# Patient Record
Sex: Female | Born: 1995 | Hispanic: No | Marital: Single | Smoking: Never smoker
Health system: Southern US, Community
[De-identification: ages and names within clinical notes are randomized; demographics above are authoritative.]

## PROBLEM LIST (undated history)

## (undated) DIAGNOSIS — F32A Depression, unspecified: Secondary | ICD-10-CM

## (undated) DIAGNOSIS — F329 Major depressive disorder, single episode, unspecified: Secondary | ICD-10-CM

---
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
## 2016-03-10 ENCOUNTER — Ambulatory Visit (HOSPITAL_COMMUNITY)
Admission: EM | Admit: 2016-03-10 | Discharge: 2016-03-10 | Disposition: A | Discharge status: Home / Self Care | Payer: No Typology Code available for payment source | Source: Ambulatory Visit | Attending: Emergency Medicine | Admitting: Emergency Medicine

## 2016-03-10 ENCOUNTER — Encounter (HOSPITAL_COMMUNITY): Payer: Self-pay | Admitting: Emergency Medicine

## 2016-03-10 ENCOUNTER — Emergency Department (HOSPITAL_COMMUNITY)
Admission: EM | Admit: 2016-03-10 | Discharge: 2016-03-10 | Disposition: A | Discharge status: Home / Self Care | Payer: Commercial Managed Care - PPO | Attending: Emergency Medicine | Admitting: Emergency Medicine

## 2016-03-10 DIAGNOSIS — T7421XA Adult sexual abuse, confirmed, initial encounter: Secondary | ICD-10-CM | POA: Diagnosis not present

## 2016-03-10 DIAGNOSIS — R102 Pelvic and perineal pain: Secondary | ICD-10-CM | POA: Diagnosis not present

## 2016-03-10 DIAGNOSIS — Y9389 Activity, other specified: Secondary | ICD-10-CM | POA: Insufficient documentation

## 2016-03-10 DIAGNOSIS — Z0441 Encounter for examination and observation following alleged adult rape: Secondary | ICD-10-CM | POA: Diagnosis present

## 2016-03-10 DIAGNOSIS — J029 Acute pharyngitis, unspecified: Secondary | ICD-10-CM | POA: Diagnosis not present

## 2016-03-10 DIAGNOSIS — Y9289 Other specified places as the place of occurrence of the external cause: Secondary | ICD-10-CM | POA: Insufficient documentation

## 2016-03-10 DIAGNOSIS — R070 Pain in throat: Secondary | ICD-10-CM | POA: Diagnosis not present

## 2016-03-10 DIAGNOSIS — Y998 Other external cause status: Secondary | ICD-10-CM | POA: Diagnosis not present

## 2016-03-10 DIAGNOSIS — Z8659 Personal history of other mental and behavioral disorders: Secondary | ICD-10-CM | POA: Insufficient documentation

## 2016-03-10 HISTORY — DX: Depression, unspecified: F32.A

## 2016-03-10 HISTORY — DX: Major depressive disorder, single episode, unspecified: F32.9
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
## 2016-03-10 NOTE — ED Notes (Signed)
Bed: WA13 Expected date:  Expected time:  Means of arrival:  Comments: [employer] 
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
## 2016-04-16 NOTE — Discharge Instructions (Signed)
Sexual Assault or Rape °Sexual assault is any sexual activity that a person is forced, threatened, or coerced into participating in. It may or may not involve physical contact. You are being sexually abused if you are forced to have sexual contact of any kind. Sexual assault is called rape if penetration has occurred (vaginal, oral, or anal). Many times, sexual assaults are committed by a friend, relative, or associate. Sexual assault and rape are never the victim's fault.  °Sexual assault can result in various health problems for the person who was assaulted. Some of these problems include: °· Physical injuries in the genital area or other areas of the body. °· Risk of unwanted pregnancy. °· Risk of sexually transmitted infections (STIs). °· Psychological problems such as anxiety, depression, or posttraumatic stress disorder. °WHAT STEPS SHOULD BE TAKEN AFTER A SEXUAL ASSAULT? °If you have been sexually assaulted, you should take the following steps as soon as possible: °· Go to a safe area as quickly as possible and call your local emergency services (911 in [place] Get away from the area where you have been attacked.   °· Do not wash, shower, comb your hair, or clean any part of your body.   °· Do not change your clothes.   °· Do not remove or touch anything in the area where you were assaulted.   °· Go to an emergency room for a complete physical exam. Get the necessary tests to protect yourself from STIs or pregnancy. You may be treated for an STI even if no signs of one are present. Emergency contraceptive medicines are also available to help prevent pregnancy, if this is desired. You may need to be examined by a specially trained health care provider. °· Have the health care provider collect evidence during the exam, even if you are not sure if you will file a report with the police. °· Find out how to file the correct papers with the authorities. This is important for all assaults, even if they were committed  by a family member or friend. °· Find out where you can get additional help and support, such as a local rape crisis center. °· Follow up with your health care provider as directed.   °HOW CAN YOU REDUCE THE CHANCES OF SEXUAL ASSAULT? °Take the following steps to help reduce your chances of being sexually assaulted: °· Consider carrying mace or pepper spray for protection against an attacker.   °· Consider taking a self-defense course. °· Do not try to fight off an attacker if he or she has a gun or knife.   °· Be aware of your surroundings, what is happening around you, and who might be there.   °· Be assertive, trust your instincts, and walk with confidence and direction. °· Be careful not to drink too much alcohol or use other intoxicants. These can reduce your ability to fight off an assault. °· Always lock your doors and windows. Be sure to have high-quality locks for your home.   °· Do not let people enter your house if you do not know them.   °· Get a home security system that has a siren if you are able.   °· Protect the keys to your house and car. Do not lend them out. Do not put your name and address on them. If you lose them, get your locks changed.   °· Always lock your car and have your key ready to open the door before approaching the car.   °· Park in a well-lit and busy area. °· Plan your driving routes   so that you travel on well-lit and frequently used streets.  °· Keep your car serviced. Always have at least half a tank of gas in it.   °· Do not go into isolated areas alone. This includes open garages, empty buildings or offices, or public laundry rooms.   °· Do not walk or jog alone, especially when it is dark.   °· Never hitchhike.   °· If your car breaks down, call the police for help on your cell phone and stay inside the car with your doors locked and windows up.   °· If you are being followed, go to a busy area and call for help.   °· If you are stopped by a police officer, especially one in  an unmarked police car, keep your door locked. Do not put your window down all the way. Ask the officer to show you identification first.   °· Be aware of "date rape drugs" that can be placed in a drink when you are not looking. These drugs can make you unable to fight off an assault. °FOR MORE INFORMATION °· Office on Women's Health, [place] Department of Health and Human Services: www.womenshealth.gov/violence-against-women/types-of-violence/sexual-assault-and-abuse.html °· National Sexual Assault Hotline: 1-800-656-HOPE (4673) °· National Domestic Violence Hotline: 1-800-799-SAFE (7233) or www.thehotline.org °  °This information is not intended to replace advice given to you by your health care provider. Make sure you discuss any questions you have with your health care provider. °  °Document Released: 11/30/2000 Document Revised: 08/05/2013 Document Reviewed: 07/08/2015 °Elsevier Interactive Patient Education ©2016 Elsevier Inc. ° °

## 2016-06-22 ENCOUNTER — Emergency Department (HOSPITAL_COMMUNITY)
Admission: EM | Admit: 2016-06-22 | Discharge: 2016-06-22 | Disposition: A | Discharge status: Home / Self Care | Payer: Commercial Managed Care - PPO | Attending: Emergency Medicine | Admitting: Emergency Medicine

## 2016-06-22 ENCOUNTER — Encounter (HOSPITAL_COMMUNITY): Payer: Self-pay | Admitting: Emergency Medicine

## 2016-06-22 DIAGNOSIS — F329 Major depressive disorder, single episode, unspecified: Secondary | ICD-10-CM | POA: Diagnosis not present

## 2016-06-22 DIAGNOSIS — R21 Rash and other nonspecific skin eruption: Secondary | ICD-10-CM | POA: Insufficient documentation

## 2016-06-22 NOTE — Discharge Instructions (Signed)

## 2016-06-22 NOTE — ED Notes (Signed)
Pt states that she has had a rash on her R hip/leg area x 3 days. Sometimes itches. Alert and oriented

## 2016-06-22 NOTE — ED Provider Notes (Signed)
CSN: [id number]     Arrival date & time 06/22/16  2055 History   First MD Initiated Contact with Patient 06/22/16 2249     Chief Complaint  Patient presents with  . Rash     (Consider location/radiation/quality/duration/timing/severity/associated sxs/prior Treatment) HPI Comments: Patient here complaining of diffuse rash is nonpruritic and started after she had an implantable birth control device placed. Was initially on her chest and that went away. Is now spread to her thighs. Denies any fever. No treatment use prior to arrival nothing makes it better or worse. No prior history of same  Patient is a 20 y.o. female presenting with rash. The history is provided by the patient and a parent.  Rash   Past Medical History  Diagnosis Date  . Depression    History reviewed. No pertinent past surgical history. History reviewed. No pertinent family history. Social History  Substance Use Topics  . Smoking status: Never Smoker   . Smokeless tobacco: None  . Alcohol Use: No   OB History    No data available     Review of Systems  Skin: Positive for rash.  All other systems reviewed and are negative.     Allergies  Review of patient's allergies indicates no known allergies.  Home Medications   Prior to Admission medications   Not on File   BP 109/83 mmHg  Pulse 85  Temp(Src) 98.6 F (37 C) (Oral)  Resp 18  SpO2 100%  LMP 06/16/2016 (Exact Date) Physical Exam  Constitutional: She is oriented to person, place, and time. She appears well-developed and well-nourished.  Non-toxic appearance.  HENT:  Head: Normocephalic and atraumatic.  Eyes: Conjunctivae are normal. Pupils are equal, round, and reactive to light.  Neck: Normal range of motion.  Cardiovascular: Normal rate.   Pulmonary/Chest: Effort normal.  Neurological: She is alert and oriented to person, place, and time.  Skin: Skin is warm and dry. Rash noted. Rash is papular.  Psychiatric: She has a normal mood and  affect.  Nursing note and vitals reviewed.   ED Course  Procedures (including critical care time) Labs Review Labs Reviewed - No data to display  Imaging Review No results found. I have personally reviewed and evaluated these images and lab results as part of my medical decision-making.   EKG Interpretation None      MDM   Final diagnoses:  None    Patient instructed to follow-up with her dermatologist and return precautions given    Lorre NickAnthony Malone Admire, MD 06/22/16 2303

## 2018-11-09 ENCOUNTER — Encounter (HOSPITAL_COMMUNITY): Payer: Self-pay | Admitting: *Deleted

## 2018-11-09 ENCOUNTER — Other Ambulatory Visit: Payer: Self-pay

## 2018-11-09 ENCOUNTER — Emergency Department (HOSPITAL_COMMUNITY)
Admission: EM | Admit: 2018-11-09 | Discharge: 2018-11-09 | Disposition: A | Discharge status: Home / Self Care | Payer: 59 | Attending: Emergency Medicine | Admitting: Emergency Medicine

## 2018-11-09 ENCOUNTER — Emergency Department (HOSPITAL_COMMUNITY): Payer: 59

## 2018-11-09 DIAGNOSIS — N83201 Unspecified ovarian cyst, right side: Secondary | ICD-10-CM

## 2018-11-09 DIAGNOSIS — N83291 Other ovarian cyst, right side: Secondary | ICD-10-CM | POA: Diagnosis not present

## 2018-11-09 DIAGNOSIS — R1033 Periumbilical pain: Secondary | ICD-10-CM | POA: Diagnosis present

## 2018-11-09 LAB — COMPREHENSIVE METABOLIC PANEL
ALT: 22 U/L (ref 0–44)
AST: 19 U/L (ref 15–41)
Albumin: 4.3 g/dL (ref 3.5–5.0)
Alkaline Phosphatase: 51 U/L (ref 38–126)
Anion gap: 10 (ref 5–15)
BUN: 11 mg/dL (ref 6–20)
CO2: 20 mmol/L — ABNORMAL LOW (ref 22–32)
Calcium: 9.1 mg/dL (ref 8.9–10.3)
Chloride: 107 mmol/L (ref 98–111)
Creatinine, Ser: 0.74 mg/dL (ref 0.44–1.00)
GFR calc Af Amer: >60 mL/min (ref >60)
GFR calc non Af Amer: >60 mL/min (ref >60)
Glucose, Bld: 100 mg/dL — ABNORMAL HIGH (ref 70–99)
Potassium: 3.2 mmol/L — ABNORMAL LOW (ref 3.5–5.1)
Sodium: 137 mmol/L (ref 135–145)
Total Bilirubin: 0.7 mg/dL (ref 0.3–1.2)
Total Protein: 7.3 g/dL (ref 6.5–8.1)

## 2018-11-09 LAB — I-STAT BETA HCG BLOOD, ED (MC, WL, AP ONLY): I-stat hCG, quantitative: <5 m[IU]/mL (ref <5)

## 2018-11-09 LAB — CBC WITH DIFFERENTIAL/PLATELET
Abs Immature Granulocytes: 0.03 10*3/uL (ref 0.00–0.07)
Basophils Absolute: 0 10*3/uL (ref 0.0–0.1)
Basophils Relative: 0 %
Eosinophils Absolute: 0 10*3/uL (ref 0.0–0.5)
Eosinophils Relative: 0 %
HCT: 41.6 % (ref 36.0–46.0)
Hemoglobin: 14.1 g/dL (ref 12.0–15.0)
Immature Granulocytes: 0 %
Lymphocytes Relative: 17 %
Lymphs Abs: 2.3 10*3/uL (ref 0.7–4.0)
MCH: 30.9 pg (ref 26.0–34.0)
MCHC: 33.9 g/dL (ref 30.0–36.0)
MCV: 91.2 fL (ref 80.0–100.0)
Monocytes Absolute: 0.6 10*3/uL (ref 0.1–1.0)
Monocytes Relative: 4 %
Neutro Abs: 10.5 10*3/uL — ABNORMAL HIGH (ref 1.7–7.7)
Neutrophils Relative %: 79 %
Platelets: 272 10*3/uL (ref 150–400)
RBC: 4.56 MIL/uL (ref 3.87–5.11)
RDW: 11.6 % (ref 11.5–15.5)
WBC: 13.5 10*3/uL — ABNORMAL HIGH (ref 4.0–10.5)
nRBC: 0 % (ref 0.0–0.2)

## 2018-11-09 LAB — URINALYSIS, ROUTINE W REFLEX MICROSCOPIC
Bilirubin Urine: NEGATIVE
Glucose, UA: NEGATIVE mg/dL
Hgb urine dipstick: NEGATIVE
Ketones, ur: 80 mg/dL — AB
Leukocytes, UA: NEGATIVE
Nitrite: NEGATIVE
Protein, ur: NEGATIVE mg/dL
Specific Gravity, Urine: 1.02 (ref 1.005–1.030)
pH: 5 (ref 5.0–8.0)

## 2018-11-09 LAB — LIPASE, BLOOD: Lipase: 29 U/L (ref 11–51)

## 2018-11-09 MED ORDER — IOHEXOL 300 MG/ML  SOLN
100.0000 mL | Freq: Once | INTRAMUSCULAR | Status: AC | PRN
  Administered 2018-11-09: 100 mL via INTRAVENOUS

## 2018-11-09 NOTE — ED Notes (Signed)
Patient transported to CT 

## 2018-11-09 NOTE — ED Provider Notes (Signed)
[facility] Provider Note   CSN: [id number] Arrival date & time: 11/09/18  1615     History   Chief Complaint Chief Complaint  Patient presents with  . Abdominal Pain    HPI Crystal Mcdonald is a 22 y.o. female.  HPI   22 year old female with abdominal pain.  Began around 3:00 this morning while in bed.  Pain is periumbilical.  Is been persistent throughout the day.  Worse with certain movements.  Denies any associated symptoms such as nausea, vomiting or diarrhea.  No urinary complaints.  No unusual vaginal bleeding or discharge.  No fevers or chills.  No significant past medical history.  She has not tried taking anything for symptoms.  Past Medical History:  Diagnosis Date  . Depression     There are no active problems to display for this patient.   History reviewed. No pertinent surgical history.   OB History   None      Home Medications    Prior to Admission medications   Not on File    Family History History reviewed. No pertinent family history.  Social History Social History   Tobacco Use  . Smoking status: Never Smoker  Substance Use Topics  . Alcohol use: No  . Drug use: No     Allergies   Patient has no known allergies.   Review of Systems Review of Systems All systems reviewed and negative, other than as noted in HPI.   Physical Exam Updated Vital Signs BP 132/86 (BP Location: Right Arm)   Pulse (!) 112   Temp 98.2 F (36.8 C) (Oral)   Resp 18   SpO2 99%   Physical Exam  Constitutional: She appears well-developed and well-nourished. No distress.  HENT:  Head: Normocephalic and atraumatic.  Eyes: Conjunctivae are normal. Right eye exhibits no discharge. Left eye exhibits no discharge.  Neck: Neck supple.  Cardiovascular: Normal rate, regular rhythm and normal heart sounds. Exam reveals no gallop and no friction rub.  No murmur heard. Pulmonary/Chest: Effort normal and breath sounds  normal. No respiratory distress.  Abdominal: Soft. She exhibits no distension. There is tenderness.  Tenderness in the right lower quadrant without rebound or guarding.  Musculoskeletal: She exhibits no edema or tenderness.  Neurological: She is alert.  Skin: Skin is warm and dry.  Psychiatric: She has a normal mood and affect. Her behavior is normal. Thought content normal.  Nursing note and vitals reviewed.    ED Treatments / Results  Labs (all labs ordered are listed, but only abnormal results are displayed) Labs Reviewed  CBC WITH DIFFERENTIAL/PLATELET - Abnormal; Notable for the following components:      Result Value   WBC 13.5 (*)    Neutro Abs 10.5 (*)    All other components within normal limits  COMPREHENSIVE METABOLIC PANEL - Abnormal; Notable for the following components:   Potassium 3.2 (*)    CO2 20 (*)    Glucose, Bld 100 (*)    All other components within normal limits  URINALYSIS, ROUTINE W REFLEX MICROSCOPIC - Abnormal; Notable for the following components:   Ketones, ur 80 (*)    All other components within normal limits  LIPASE, BLOOD  I-STAT BETA HCG BLOOD, ED ([facility], [facility], [facility] ONLY)    EKG None  Radiology No results found.   Ct Abdomen Pelvis W Contrast  Result Date: 11/09/2018 CLINICAL DATA:  Acute onset paraumbilical pain this morning. Suspected appendicitis. EXAM: CT ABDOMEN AND  PELVIS WITH CONTRAST TECHNIQUE: Multidetector CT imaging of the abdomen and pelvis was performed using the standard protocol following bolus administration of intravenous contrast. CONTRAST:  OMNIPAQUE IOHEXOL 300 MG/ML  SOLN COMPARISON:  None. FINDINGS: Lower Chest: No acute findings. Hepatobiliary: No hepatic masses identified. Gallbladder is unremarkable. Pancreas:  No mass or inflammatory changes. Spleen: Within normal limits in size and appearance. Adrenals/Urinary Tract: No masses identified. No evidence of hydronephrosis. Stomach/Bowel: No evidence of obstruction,  inflammatory process or abnormal fluid collections. Normal appendix visualized. Vascular/Lymphatic: No pathologically enlarged lymph nodes. No abdominal aortic aneurysm. Reproductive: Normal appearance of uterus. A simple appearing right ovarian cyst is seen measuring 3.3 x 2.4 cm. No evidence of inflammatory process or abnormal fluid collections. Other:  None. Musculoskeletal: No suspicious bone lesions identified. 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 IMPRESSION: No evidence of appendicitis. 3.3 cm benign-appearing right ovarian cyst, most likely physiologic. No further imaging evaluation required. This recommendation follows ACR consensus guidelines: White Paper of the ACR Incidental Findings Committee II on Adnexal Findings. J Am Coll Radiol [id number]. Incidentally noted bilateral L5 pars defects. Electronically Signed   By: Myles Rosenthal M.D.   On: 11/09/2018 20:03    Procedures Procedures (including critical care time)  Medications Ordered in ED Medications  iohexol (OMNIPAQUE) 300 MG/ML solution 100 mL (100 mLs Intravenous Contrast Given 11/09/18 1913)     Initial Impression / Assessment and Plan / ED Course  I have reviewed the triage vital signs and the nursing notes.  Pertinent labs & imaging results that were available during my care of the patient were reviewed by me and considered in my medical decision making (see chart for details).     22 year old female with periumbilical abdominal pain.  She is actually tender in her right lower quadrant without peritoneal signs.  Will check basic labs, UA and CT the abdomen and pelvis.  Declining pain medicine currently.  CT with ovarian cyst. Likely explanation of her symptoms. Symptomatic tx. Return precautions discussed.   Final Clinical Impressions(s) / ED Diagnoses   Final diagnoses:  Cyst of right ovary    ED Discharge Orders    None       Raeford Razor, MD 11/20/18 1052

## 2018-11-09 NOTE — ED Triage Notes (Signed)
Pt reports mid abd pain around umbilicus, started around 0300. Pt denies urinary symptoms or n/v/d.

## 2019-08-21 IMAGING — CT CT ABD-PELV W/ CM
2 of 4 series · 16 of 46 positions shown, 18 images · IV contrast (APPLIED)
Comparison: None.

CLINICAL DATA: Acute onset paraumbilical pain this morning.
Suspected appendicitis.

EXAM:
CT ABDOMEN AND PELVIS WITH CONTRAST
TECHNIQUE: Multidetector CT imaging of the abdomen and pelvis was performed
using the standard protocol following bolus administration of
intravenous contrast.
CONTRAST:  100mL OMNIPAQUE IOHEXOL 300 MG/ML  SOLN

[Series 3: abdomen 5.0 · axial · 0.80mm/px · z∈[-484,-94]mm · 13 of 91 slices shown, 15 images]
[im 7/91  soft-tissue]
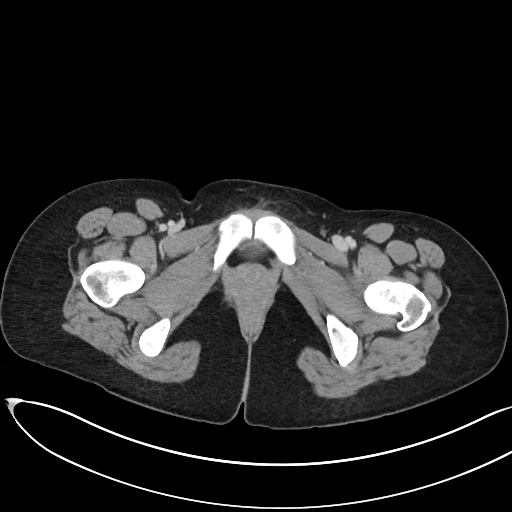
[im 7/91  bone]
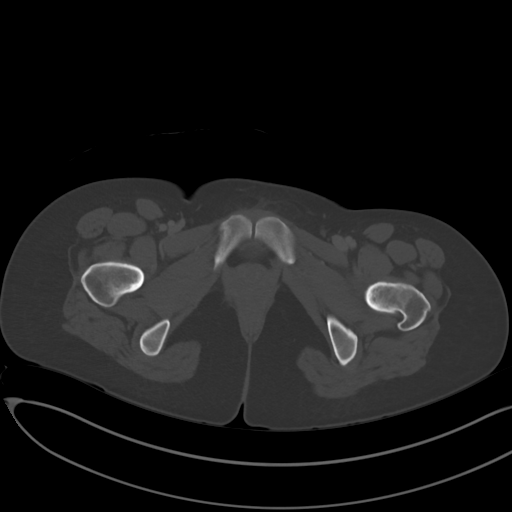
[im 13/91  soft-tissue]
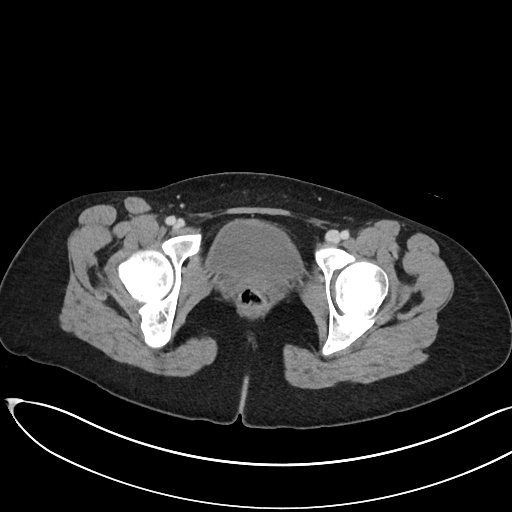
[im 19/91  soft-tissue]
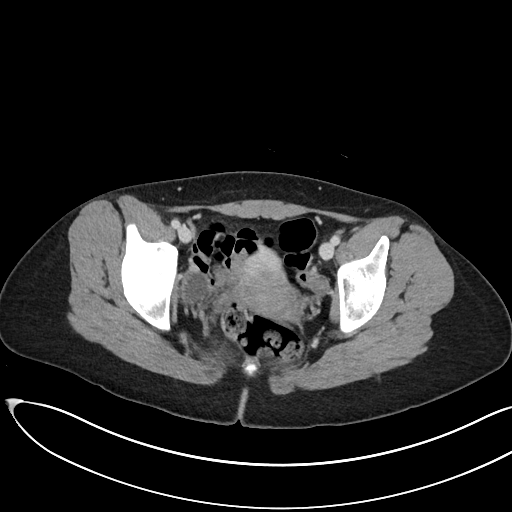
[im 25/91  soft-tissue]
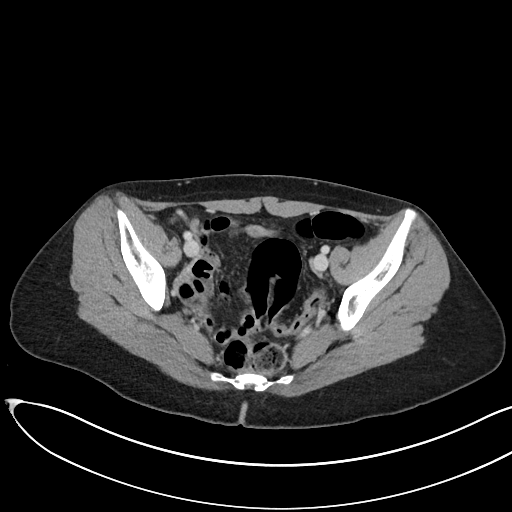
[im 31/91  soft-tissue]
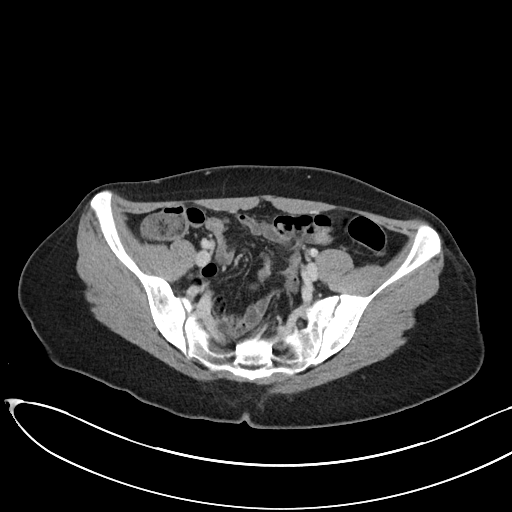
[im 37/91  soft-tissue]
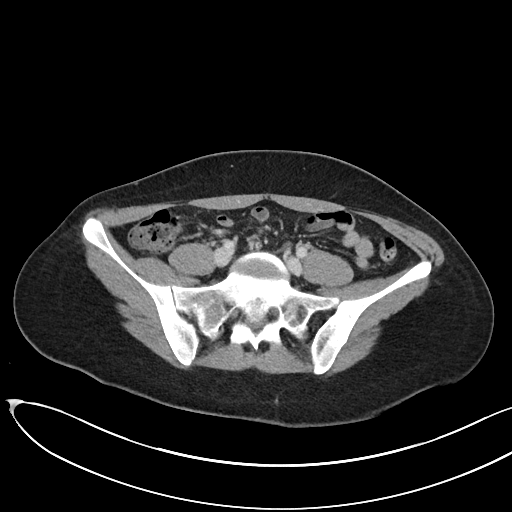
[im 49/91  soft-tissue]
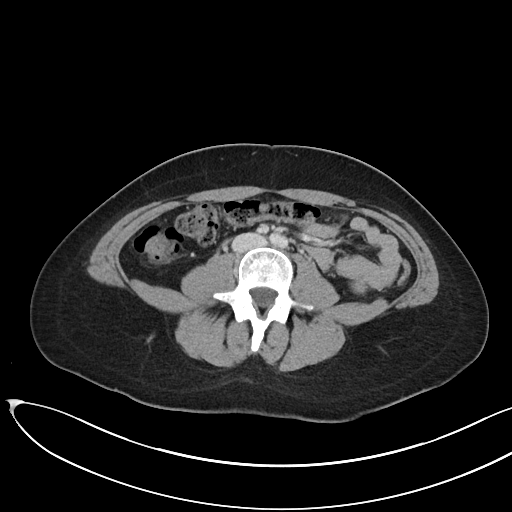
[im 55/91  soft-tissue]
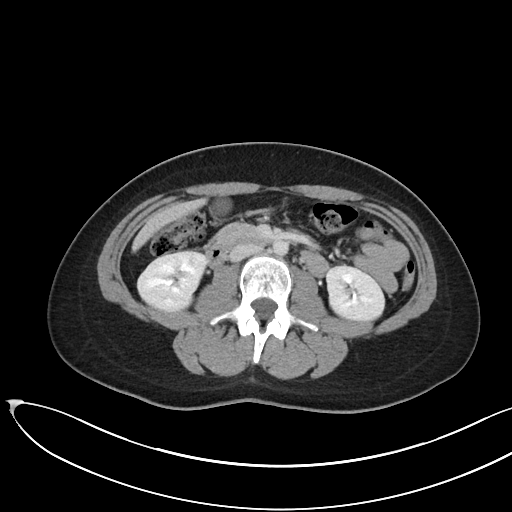
[im 61/91  soft-tissue]
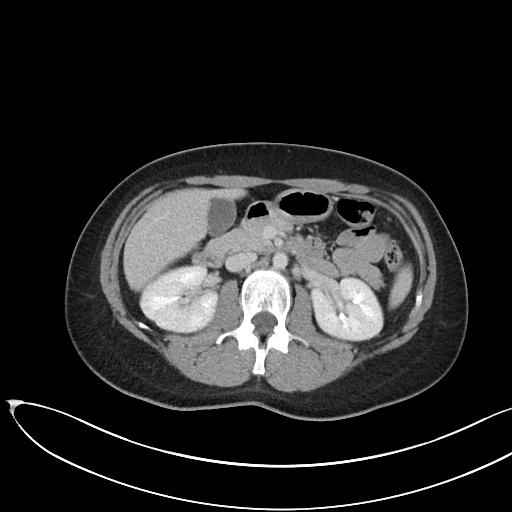
[im 61/91  bone]
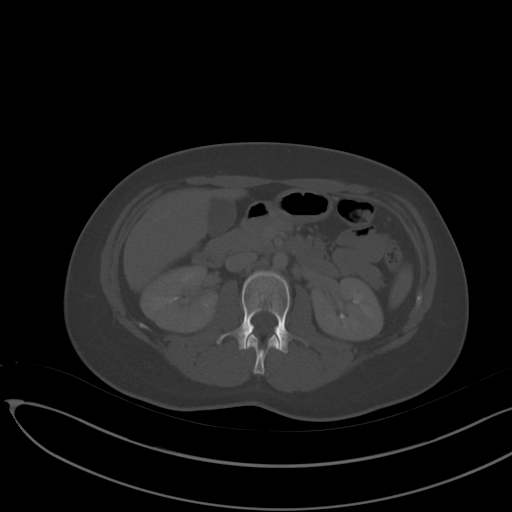
[im 67/91  soft-tissue]
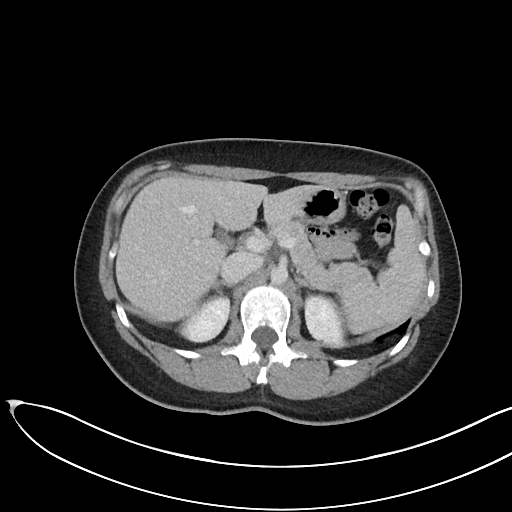
[im 73/91  soft-tissue]
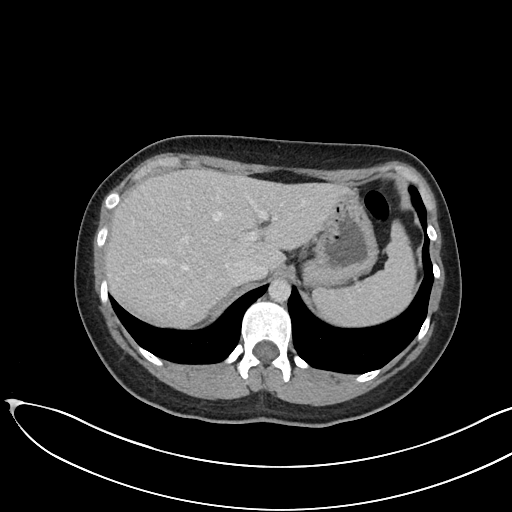
[im 79/91  soft-tissue]
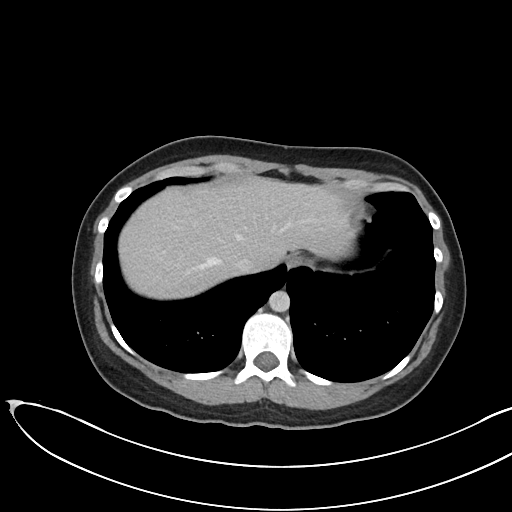
[im 85/91  soft-tissue]
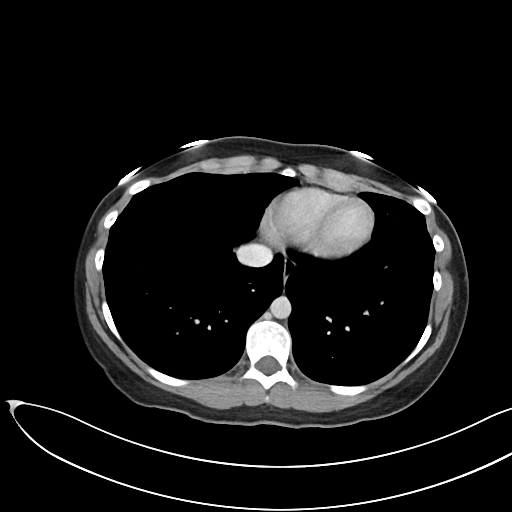

[Series 6: abdomen 3.0 mpr cor · coronal · 0.63mm/px · 3 of 75 slices shown]
[im 25/75  soft-tissue]
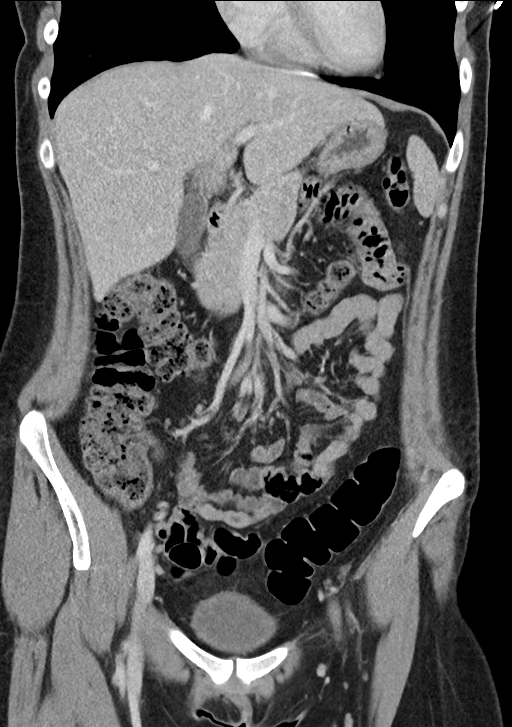
[im 33/75  soft-tissue]
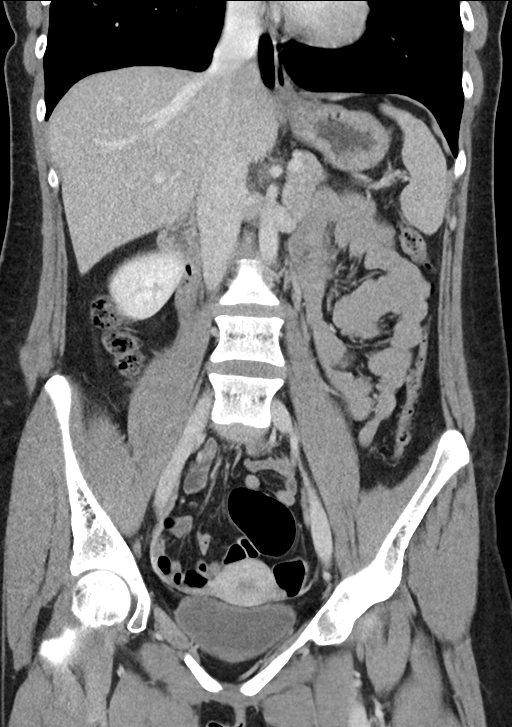
[im 42/75  soft-tissue]
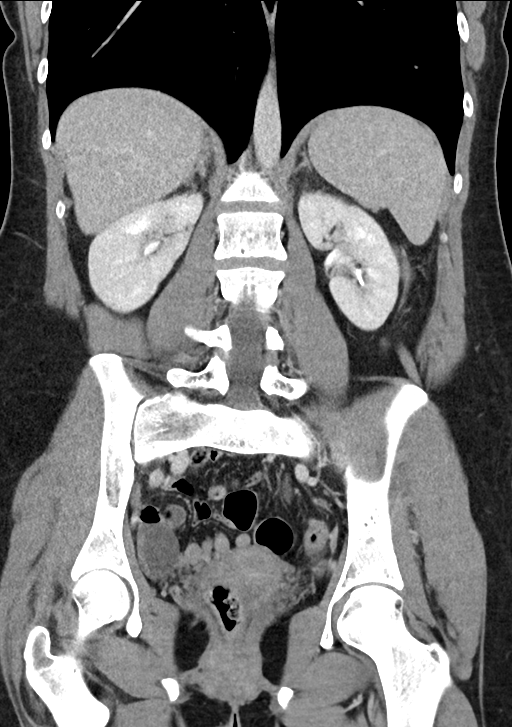

[16 of 46 positions shown; findings below may reference images not displayed]

FINDINGS: Lower Chest: No acute findings.

Hepatobiliary: No hepatic masses identified. Gallbladder is
unremarkable.

Pancreas:  No mass or inflammatory changes.

Spleen: Within normal limits in size and appearance.

Adrenals/Urinary Tract: No masses identified. No evidence of
hydronephrosis.

Stomach/Bowel: No evidence of obstruction, inflammatory process or
abnormal fluid collections. Normal appendix visualized.

Vascular/Lymphatic: No pathologically enlarged lymph nodes. No
abdominal aortic aneurysm.

Reproductive: Normal appearance of uterus. A simple appearing right
ovarian cyst is seen measuring 3.3 x 2.4 cm. No evidence of
inflammatory process or abnormal fluid collections.

Other:  None.

Musculoskeletal: No suspicious bone lesions identified. Incidentally
noted bilateral L5 pars defects without associated
spondylolisthesis.
IMPRESSION: No evidence of appendicitis.

3.3 cm benign-appearing right ovarian cyst, most likely physiologic.
No further imaging evaluation required. This recommendation follows
ACR consensus guidelines: White Paper of the ACR Incidental Findings
Committee II on Adnexal Findings. [HOSPITAL] [DATE].

Incidentally noted bilateral L5 pars defects.

## 2020-09-19 DIAGNOSIS — Z20822 Contact with and (suspected) exposure to covid-19: Secondary | ICD-10-CM | POA: Diagnosis not present

## 2022-02-02 ENCOUNTER — Encounter (HOSPITAL_COMMUNITY): Payer: Self-pay | Admitting: *Deleted

## 2022-02-02 ENCOUNTER — Other Ambulatory Visit: Payer: Self-pay

## 2022-02-02 ENCOUNTER — Emergency Department (HOSPITAL_COMMUNITY)
Admission: EM | Admit: 2022-02-02 | Discharge: 2022-02-03 | Disposition: A | Discharge status: Home / Self Care | Payer: BC Managed Care – PPO | Attending: Emergency Medicine | Admitting: Emergency Medicine

## 2022-02-02 DIAGNOSIS — R1084 Generalized abdominal pain: Secondary | ICD-10-CM | POA: Diagnosis not present

## 2022-02-02 DIAGNOSIS — R1031 Right lower quadrant pain: Secondary | ICD-10-CM | POA: Diagnosis present

## 2022-02-02 LAB — CBC
HCT: 43.3 % (ref 36.0–46.0)
Hemoglobin: 14.7 g/dL (ref 12.0–15.0)
MCH: 31.1 pg (ref 26.0–34.0)
MCHC: 33.9 g/dL (ref 30.0–36.0)
MCV: 91.5 fL (ref 80.0–100.0)
Platelets: 295 10*3/uL (ref 150–400)
RBC: 4.73 MIL/uL (ref 3.87–5.11)
RDW: 12.2 % (ref 11.5–15.5)
WBC: 8.6 10*3/uL (ref 4.0–10.5)
nRBC: 0 % (ref 0.0–0.2)

## 2022-02-02 LAB — LIPASE, BLOOD: Lipase: 33 U/L (ref 11–51)

## 2022-02-02 LAB — COMPREHENSIVE METABOLIC PANEL
ALT: 19 U/L (ref 0–44)
AST: 19 U/L (ref 15–41)
Albumin: 4.2 g/dL (ref 3.5–5.0)
Alkaline Phosphatase: 56 U/L (ref 38–126)
Anion gap: 10 (ref 5–15)
BUN: 9 mg/dL (ref 6–20)
CO2: 22 mmol/L (ref 22–32)
Calcium: 9.4 mg/dL (ref 8.9–10.3)
Chloride: 106 mmol/L (ref 98–111)
Creatinine, Ser: 0.79 mg/dL (ref 0.44–1.00)
GFR, Estimated: >60 mL/min (ref >60)
Glucose, Bld: 113 mg/dL — ABNORMAL HIGH (ref 70–99)
Potassium: 3.3 mmol/L — ABNORMAL LOW (ref 3.5–5.1)
Sodium: 138 mmol/L (ref 135–145)
Total Bilirubin: 0.4 mg/dL (ref 0.3–1.2)
Total Protein: 7.3 g/dL (ref 6.5–8.1)

## 2022-02-02 LAB — URINALYSIS, ROUTINE W REFLEX MICROSCOPIC
Bilirubin Urine: NEGATIVE
Glucose, UA: NEGATIVE mg/dL
Ketones, ur: NEGATIVE mg/dL
Nitrite: NEGATIVE
Protein, ur: NEGATIVE mg/dL
Specific Gravity, Urine: 1.012 (ref 1.005–1.030)
pH: 5 (ref 5.0–8.0)

## 2022-02-02 LAB — PREGNANCY, URINE: Preg Test, Ur: NEGATIVE
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
## 2022-02-03 ENCOUNTER — Emergency Department (HOSPITAL_COMMUNITY): Payer: BC Managed Care – PPO

## 2022-02-03 DIAGNOSIS — R1084 Generalized abdominal pain: Secondary | ICD-10-CM | POA: Diagnosis not present

## 2022-02-03 MED ORDER — IOHEXOL 350 MG/ML SOLN
100.0000 mL | Freq: Once | INTRAVENOUS | Status: AC | PRN
  Administered 2022-02-03: 100 mL via INTRAVENOUS
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
## 2022-02-03 NOTE — Discharge Instructions (Signed)

## 2022-11-15 IMAGING — CT CT ABD-PELV W/ CM
2 of 5 series · 16 of 46 positions shown, 18 images · IV contrast (Omni 300)
Comparison: CT 11/09/2018

CLINICAL DATA: Right lower quadrant pain.

EXAM:
CT ABDOMEN AND PELVIS WITH CONTRAST
TECHNIQUE: Multidetector CT imaging of the abdomen and pelvis was performed
using the standard protocol following bolus administration of
intravenous contrast.

[Series 3: a/p w/ 5mm · axial · 0.90mm/px · z∈[+778,+1203]mm · 13 of 97 slices shown, 15 images]
[im 6/97  soft-tissue]
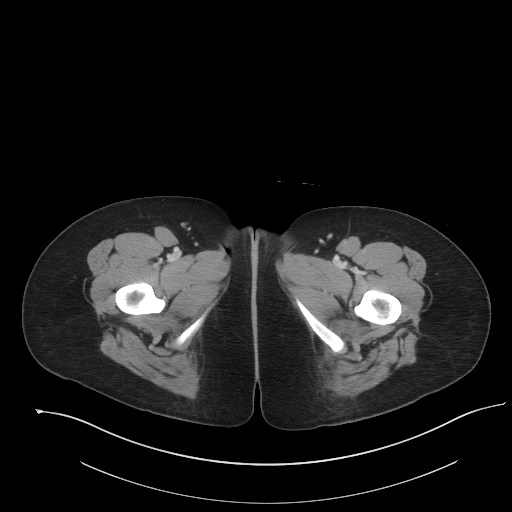
[im 6/97  bone]
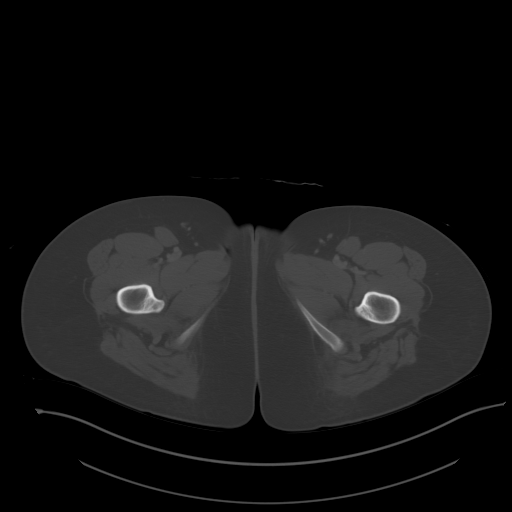
[im 16/97  soft-tissue]
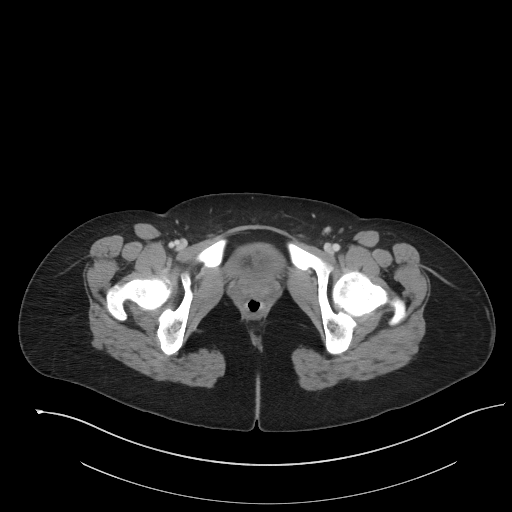
[im 21/97  soft-tissue]
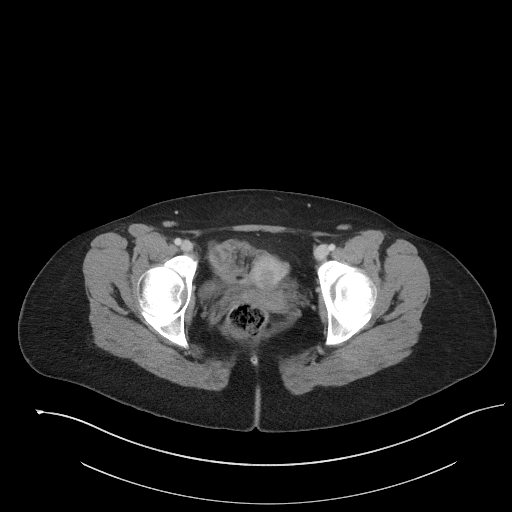
[im 26/97  soft-tissue]
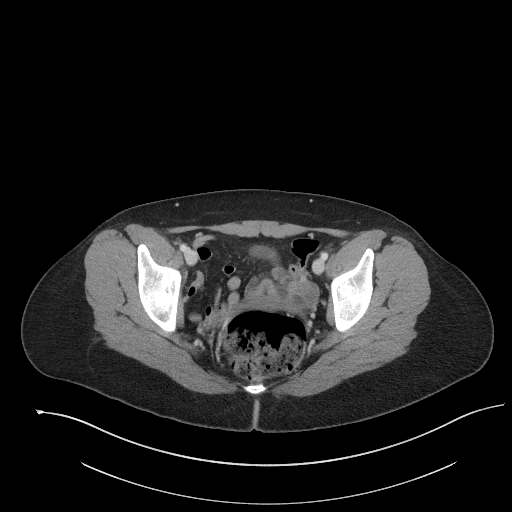
[im 36/97  soft-tissue]
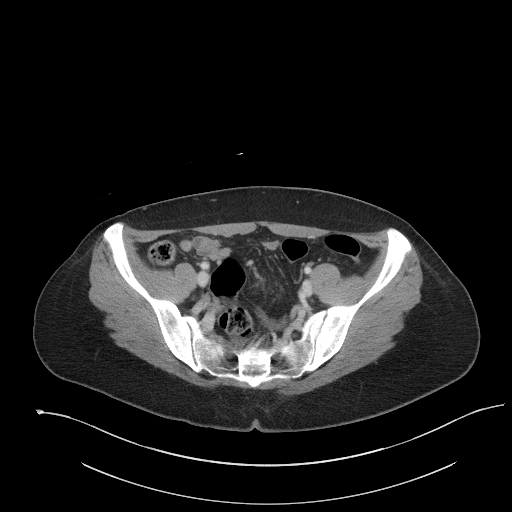
[im 41/97  soft-tissue]
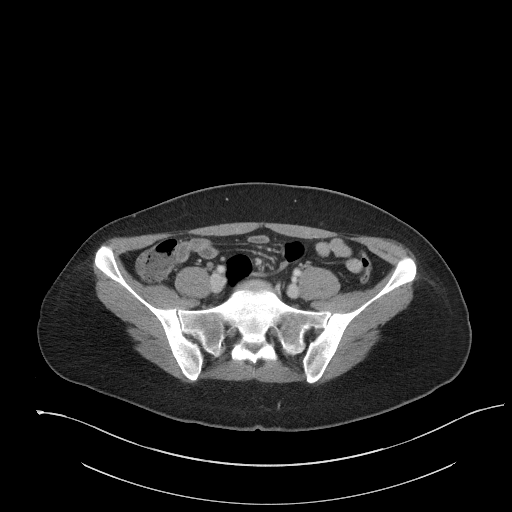
[im 51/97  soft-tissue]
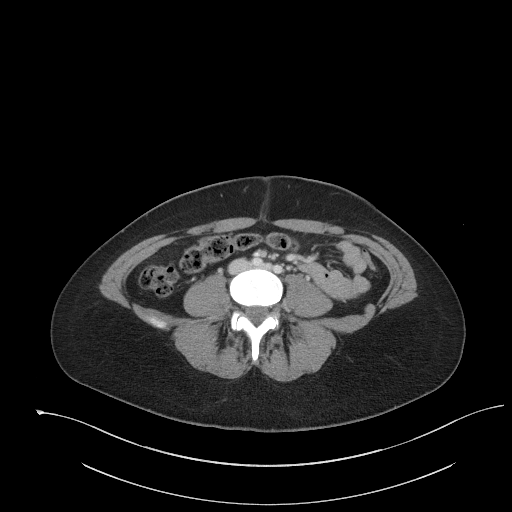
[im 56/97  soft-tissue]
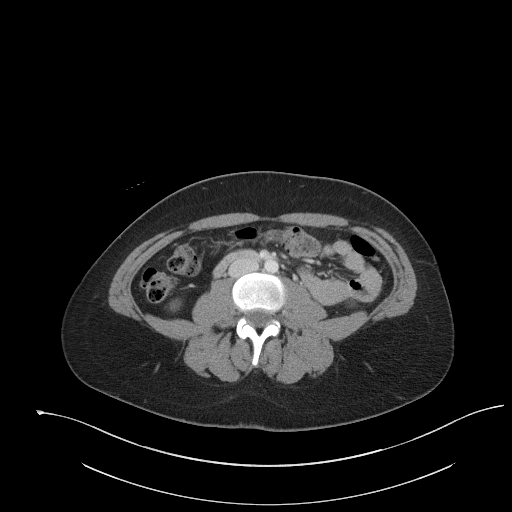
[im 61/97  soft-tissue]
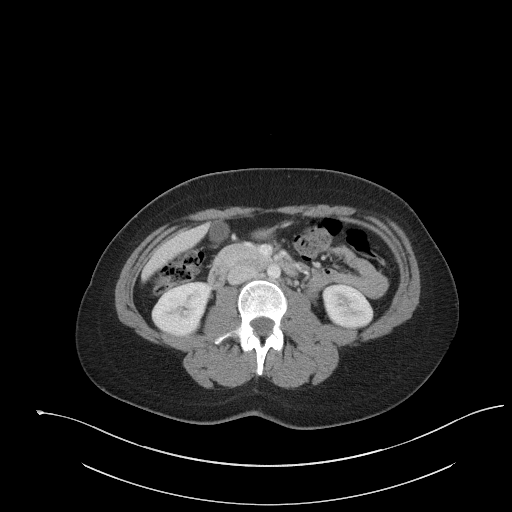
[im 61/97  bone]
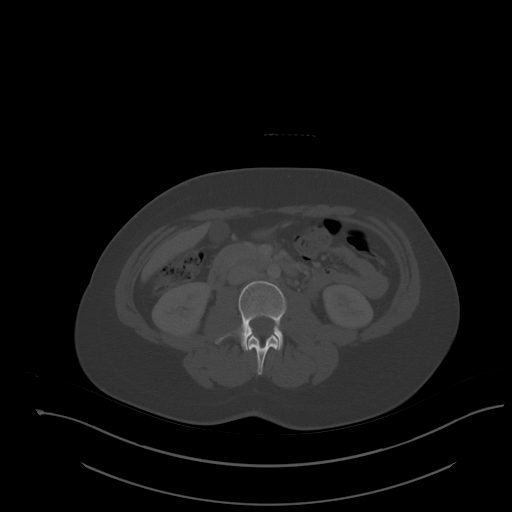
[im 71/97  soft-tissue]
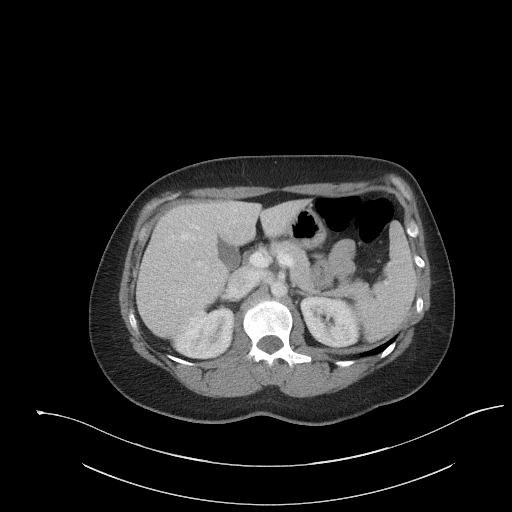
[im 76/97  soft-tissue]
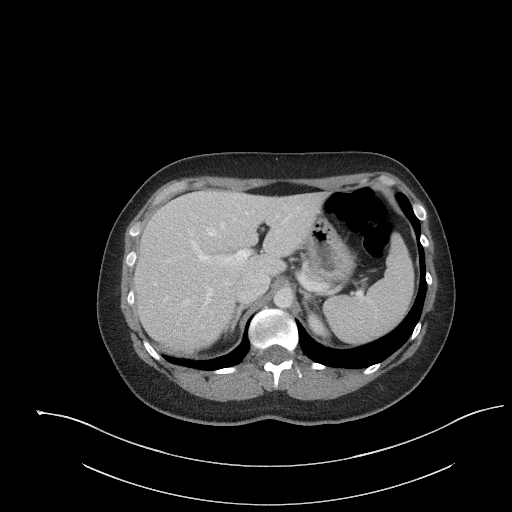
[im 81/97  soft-tissue]
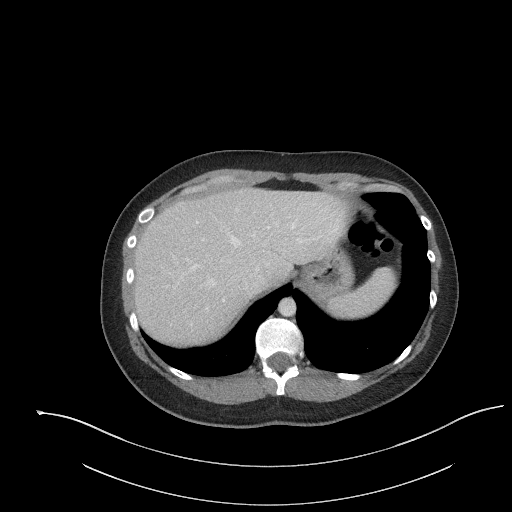
[im 91/97  soft-tissue]
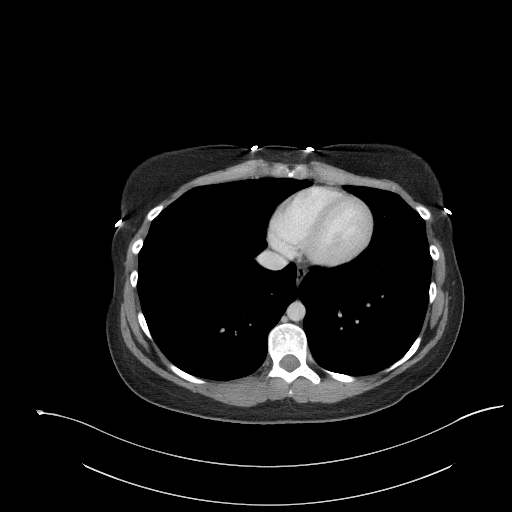

[Series 6: a/p w/ cor · coronal · 0.83mm/px · 3 of 139 slices shown]
[im 47/139  soft-tissue]
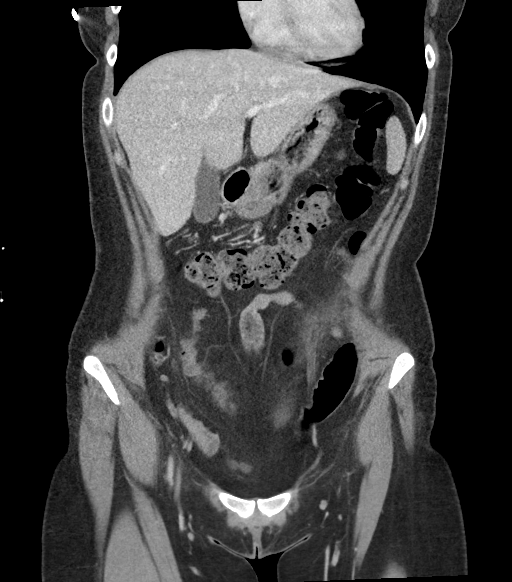
[im 62/139  soft-tissue]
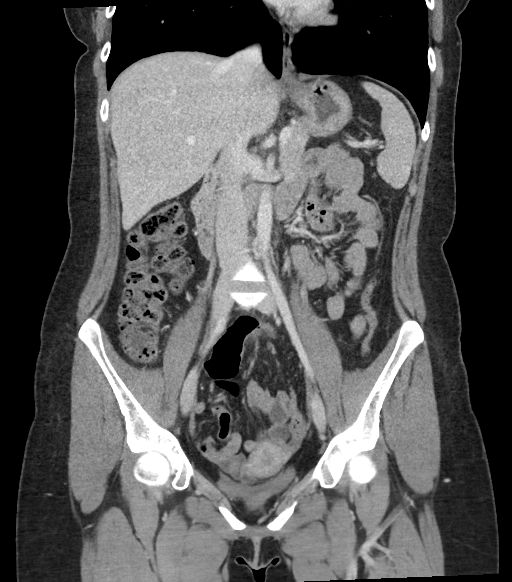
[im 77/139  soft-tissue]
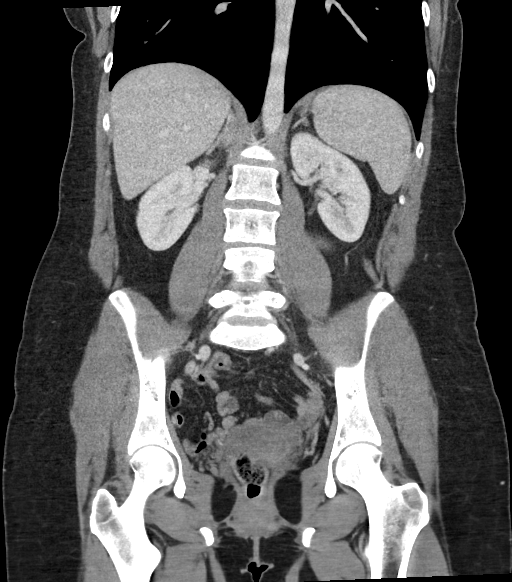

[16 of 46 positions shown; findings below may reference images not displayed]

RADIATION DOSE REDUCTION: This exam was performed according to the
departmental dose-optimization program which includes automated
exposure control, adjustment of the mA and/or kV according to
patient size and/or use of iterative reconstruction technique.

CONTRAST:  100mL OMNIPAQUE IOHEXOL 350 MG/ML SOLN
FINDINGS: Lower chest: Clear lung bases. No focal airspace disease or pleural
effusion.

Hepatobiliary: No focal liver abnormality is seen. No gallstones,
gallbladder wall thickening, or biliary dilatation.

Pancreas: No ductal dilatation or inflammation.

Spleen: Normal in size without focal abnormality.

Adrenals/Urinary Tract: Normal adrenal glands No hydronephrosis or
perinephric edema. Homogeneous renal enhancement. No renal calculi
or focal renal abnormality. Urinary bladder is completely empty and
not well assessed.

Stomach/Bowel: Normal appendix, series 3, image 66. Normal terminal
ileum. No small bowel obstruction or inflammation. Nondistended
stomach. Small volume of colonic stool without colonic inflammation.

Vascular/Lymphatic: Normal caliber abdominal aorta. Patent portal,
splenic, mesenteric veins. No abdominopelvic adenopathy.

Reproductive: Anteverted uterus. The ovaries are difficult to
delineate from adjacent bowel loops. No suspicious adnexal mass.

Other: No free air, free fluid, or intra-abdominal fluid collection.
Tiny fat containing umbilical hernia.

Musculoskeletal: Bilateral L5 pars interarticularis defects with
trace anterolisthesis of L5 on S1. No acute osseous findings.
IMPRESSION: 1. No acute abnormality in the abdomen/pelvis. Normal appendix.
2. Chronic bilateral L5 pars interarticularis defects with trace
anterolisthesis of L5 on S1.

## 2023-06-19 ENCOUNTER — Inpatient Hospital Stay (HOSPITAL_COMMUNITY)
Admission: EM | Admit: 2023-06-19 | Discharge: 2023-06-21 | DRG: 201 | Disposition: A | Discharge status: Home / Self Care | Payer: BC Managed Care – PPO | Attending: Internal Medicine | Admitting: Internal Medicine

## 2023-06-19 ENCOUNTER — Encounter (HOSPITAL_COMMUNITY): Payer: Self-pay | Admitting: Emergency Medicine

## 2023-06-19 ENCOUNTER — Emergency Department (HOSPITAL_COMMUNITY): Payer: BC Managed Care – PPO

## 2023-06-19 ENCOUNTER — Other Ambulatory Visit: Payer: Self-pay

## 2023-06-19 DIAGNOSIS — J9383 Other pneumothorax: Secondary | ICD-10-CM | POA: Diagnosis not present

## 2023-06-19 DIAGNOSIS — J9311 Primary spontaneous pneumothorax: Secondary | ICD-10-CM | POA: Diagnosis present

## 2023-06-19 DIAGNOSIS — J939 Pneumothorax, unspecified: Principal | ICD-10-CM

## 2023-06-19 DIAGNOSIS — F32A Depression, unspecified: Secondary | ICD-10-CM

## 2023-06-19 DIAGNOSIS — Z79899 Other long term (current) drug therapy: Secondary | ICD-10-CM

## 2023-06-19 LAB — CBC
HCT: 40.4 % (ref 36.0–46.0)
Hemoglobin: 13.8 g/dL (ref 12.0–15.0)
MCH: 30.6 pg (ref 26.0–34.0)
MCHC: 34.2 g/dL (ref 30.0–36.0)
MCV: 89.6 fL (ref 80.0–100.0)
Platelets: 344 10*3/uL (ref 150–400)
RBC: 4.51 MIL/uL (ref 3.87–5.11)
RDW: 12 % (ref 11.5–15.5)
WBC: 12.9 10*3/uL — ABNORMAL HIGH (ref 4.0–10.5)
nRBC: 0 % (ref 0.0–0.2)

## 2023-06-19 LAB — BASIC METABOLIC PANEL
Anion gap: 10 (ref 5–15)
BUN: 12 mg/dL (ref 6–20)
CO2: 20 mmol/L — ABNORMAL LOW (ref 22–32)
Calcium: 9.6 mg/dL (ref 8.9–10.3)
Chloride: 108 mmol/L (ref 98–111)
Creatinine, Ser: 0.77 mg/dL (ref 0.44–1.00)
GFR, Estimated: >60 mL/min (ref >60)
Glucose, Bld: 108 mg/dL — ABNORMAL HIGH (ref 70–99)
Potassium: 3.4 mmol/L — ABNORMAL LOW (ref 3.5–5.1)
Sodium: 138 mmol/L (ref 135–145)

## 2023-06-19 MED ORDER — SENNOSIDES-DOCUSATE SODIUM 8.6-50 MG PO TABS: 1.0000 | ORAL_TABLET | Freq: Every evening | ORAL | Status: DC | PRN

## 2023-06-19 MED ORDER — ACETAMINOPHEN 325 MG PO TABS
650.0000 mg | ORAL_TABLET | Freq: Four times a day (QID) | ORAL | Status: DC | PRN
  Administered 2023-06-20 – 2023-06-21 (×2): 650 mg via ORAL
  Filled 2023-06-19 (×2): qty 2

## 2023-06-19 MED ORDER — SODIUM CHLORIDE 0.9 % IV SOLN: Freq: Once | INTRAVENOUS | Status: AC

## 2023-06-19 MED ORDER — ONDANSETRON HCL 4 MG/2ML IJ SOLN
4.0000 mg | Freq: Four times a day (QID) | INTRAMUSCULAR | Status: DC | PRN
  Administered 2023-06-19: 4 mg via INTRAVENOUS
  Filled 2023-06-19: qty 2

## 2023-06-19 MED ORDER — MORPHINE SULFATE (PF) 4 MG/ML IV SOLN
4.0000 mg | Freq: Once | INTRAVENOUS | Status: AC
  Administered 2023-06-19: 4 mg via INTRAVENOUS
  Filled 2023-06-19: qty 1

## 2023-06-19 MED ORDER — ORAL CARE MOUTH RINSE: 15.0000 mL | OROMUCOSAL | Status: DC | PRN

## 2023-06-19 MED ORDER — LORAZEPAM 2 MG/ML IJ SOLN
INTRAMUSCULAR | Status: AC
  Administered 2023-06-19: 1 mg via INTRAVENOUS
  Filled 2023-06-19: qty 1

## 2023-06-19 MED ORDER — ONDANSETRON HCL 4 MG PO TABS: 4.0000 mg | ORAL_TABLET | Freq: Four times a day (QID) | ORAL | Status: DC | PRN

## 2023-06-19 MED ORDER — KETOROLAC TROMETHAMINE 15 MG/ML IJ SOLN
15.0000 mg | Freq: Four times a day (QID) | INTRAMUSCULAR | Status: DC
  Administered 2023-06-19 – 2023-06-21 (×8): 15 mg via INTRAVENOUS
  Filled 2023-06-19 (×8): qty 1

## 2023-06-19 MED ORDER — SODIUM CHLORIDE 0.9% FLUSH
3.0000 mL | Freq: Two times a day (BID) | INTRAVENOUS | Status: DC
  Administered 2023-06-19 – 2023-06-21 (×4): 3 mL via INTRAVENOUS

## 2023-06-19 MED ORDER — KETAMINE HCL 50 MG/5ML IJ SOSY: 0.5000 mg/kg | PREFILLED_SYRINGE | Freq: Once | INTRAMUSCULAR | Status: AC

## 2023-06-19 MED ORDER — ACETAMINOPHEN 650 MG RE SUPP: 650.0000 mg | Freq: Four times a day (QID) | RECTAL | Status: DC | PRN

## 2023-06-19 MED ORDER — POTASSIUM CHLORIDE 20 MEQ PO PACK
40.0000 meq | PACK | Freq: Every day | ORAL | Status: DC
  Administered 2023-06-19 – 2023-06-21 (×3): 40 meq via ORAL
  Filled 2023-06-19 (×3): qty 2

## 2023-06-19 MED ORDER — SODIUM CHLORIDE 0.9% FLUSH
10.0000 mL | Freq: Three times a day (TID) | INTRAVENOUS | Status: DC
  Administered 2023-06-19 – 2023-06-21 (×5): 10 mL via INTRAPLEURAL

## 2023-06-19 MED ORDER — KETAMINE HCL 50 MG/5ML IJ SOSY
PREFILLED_SYRINGE | INTRAMUSCULAR | Status: AC
  Administered 2023-06-19: 44 mg via INTRAVENOUS
  Filled 2023-06-19: qty 20

## 2023-06-19 MED ORDER — LORAZEPAM 2 MG/ML IJ SOLN
1.0000 mg | Freq: Once | INTRAMUSCULAR | Status: AC
  Administered 2023-06-19: 1 mg via INTRAVENOUS

## 2023-06-19 MED ORDER — POTASSIUM CHLORIDE CRYS ER 20 MEQ PO TBCR: 40.0000 meq | EXTENDED_RELEASE_TABLET | Freq: Once | ORAL | Status: DC

## 2023-06-19 MED ORDER — MORPHINE SULFATE (PF) 2 MG/ML IV SOLN
2.0000 mg | INTRAVENOUS | Status: DC | PRN
  Administered 2023-06-19: 4 mg via INTRAVENOUS
  Filled 2023-06-19: qty 2

## 2023-06-19 MED ORDER — LORAZEPAM 2 MG/ML IJ SOLN: 1.0000 mg | Freq: Once | INTRAMUSCULAR | Status: AC

## 2023-06-19 MED ORDER — ESCITALOPRAM OXALATE 10 MG PO TABS
10.0000 mg | ORAL_TABLET | Freq: Every evening | ORAL | Status: DC
  Administered 2023-06-19 – 2023-06-20 (×2): 10 mg via ORAL
  Filled 2023-06-19 (×3): qty 1

## 2023-06-19 NOTE — ED Notes (Signed)
ED TO INPATIENT HANDOFF REPORT  ED Nurse Name and Phone #: [phone]  S Name/Age/Gender Crystal Mcdonald 27 y.o. female Room/Bed: 017C/017C  Code Status   Code Status: Not on file  Home/SNF/Other Home Patient oriented to: self, place, time, and situation Is this baseline? Yes   Triage Complete: Triage complete  Chief Complaint Spontaneous pneumothorax [J93.83]  Triage Note Patient arrives ambulatory by POV states her PCP sent her here for pneumothorax to right side. Patient states she saw her doctor due to having shortness of breath since Friday. Patient speaking in full sentences.    Allergies No Known Allergies  Level of Care/Admitting Diagnosis ED Disposition     ED Disposition  Admit   Condition  --   Comment  Hospital Area: [facility] [[id number]]  Level of Care: Progressive [102]  Admit to Progressive based on following criteria: RESPIRATORY PROBLEMS hypoxemic/hypercapnic respiratory failure that is responsive to NIPPV (BiPAP) or High Flow Nasal Cannula (6-80 lpm). Frequent assessment/intervention, no > Q2 hrs < Q4 hrs, to maintain oxygenation and pulmonary hygiene.  May admit patient to [facility] or [facility] if equivalent level of care is available:: No  Covid Evaluation: Asymptomatic - no recent exposure (last 10 days) testing not required  Diagnosis: Spontaneous pneumothorax [454098]  Admitting Physician: Charlsie Quest [[id number]]  Attending Physician: Charlsie Quest [[id number]]  Certification:: I certify this patient will need inpatient services for at least 2 midnights  Estimated Length of Stay: 2          B Medical/Surgery History Past Medical History:  Diagnosis Date   Depression    History reviewed. No pertinent surgical history.   A IV Location/Drains/Wounds Patient Lines/Drains/Airways Status     Active Line/Drains/Airways     Name Placement date Placement time Site Days   Peripheral IV 06/19/23 20 G Right Antecubital  06/19/23  1611  Antecubital  less than 1            Intake/Output Last 24 hours No intake or output data in the 24 hours ending 06/19/23 1846  Labs/Imaging Results for orders placed or performed during the hospital encounter of 06/19/23 (from the past 48 hour(s))  CBC     Status: Abnormal   Collection Time: 06/19/23  4:12 PM  Result Value Ref Range   WBC 12.9 (H) 4.0 - 10.5 K/uL   RBC 4.51 3.87 - 5.11 MIL/uL   Hemoglobin 13.8 12.0 - 15.0 g/dL   HCT 08.6 57.8 - 46.9 %   MCV 89.6 80.0 - 100.0 fL   MCH 30.6 26.0 - 34.0 pg   MCHC 34.2 30.0 - 36.0 g/dL   RDW 62.9 52.8 - 41.3 %   Platelets 344 150 - 400 K/uL   nRBC 0.0 0.0 - 0.2 %    Comment: Performed at [facility], [address] [place], [place] [address]  Basic metabolic panel     Status: Abnormal   Collection Time: 06/19/23  4:12 PM  Result Value Ref Range   Sodium 138 135 - 145 mmol/L   Potassium 3.4 (L) 3.5 - 5.1 mmol/L   Chloride 108 98 - 111 mmol/L   CO2 20 (L) 22 - 32 mmol/L   Glucose, Bld 108 (H) 70 - 99 mg/dL    Comment: Glucose reference range applies only to samples taken after fasting for at least 8 hours.   BUN 12 6 - 20 mg/dL   Creatinine, Ser 0.27 0.44 - 1.00 mg/dL  Calcium 9.6 8.9 - 10.3 mg/dL   GFR, Estimated >16 >10 mL/min    Comment: (NOTE) Calculated using the CKD-EPI Creatinine Equation (2021)    Anion gap 10 5 - 15    Comment: Performed at [facility], [address], [place], [place] [address]   DG Chest Portable 1 View  Result Date: 06/19/2023 CLINICAL DATA:  Chest tube placement. EXAM: PORTABLE CHEST 1 VIEW COMPARISON:  06/19/2023, earlier same day FINDINGS: Interval placement of right chest tube with resolution of the right sided pneumothorax. No pleural line visible on the current study is suggests residual right-sided pleural gas. Left lung remains clear. The cardiopericardial silhouette is within normal limits for size. Telemetry leads overlie the chest. IMPRESSION: Interval  placement of right chest tube with resolution of the right-sided pneumothorax. Electronically Signed   By: Kennith Center M.D.   On: 06/19/2023 18:21   DG Chest Portable 1 View  Result Date: 06/19/2023 CLINICAL DATA:  Pneumothorax EXAM: PORTABLE CHEST 1 VIEW COMPARISON:  None Available. FINDINGS: Moderate right-sided apical pneumothorax. Pneumothorax line also extends laterally along the right hemithorax. Maximal distance from visceral to parietal pleura proximally 4.5 cm. Otherwise no consolidation or effusion. No edema. No left-sided pneumothorax. Normal cardiopericardial silhouette. Critical Value/emergent results were called by telephone at the time of interpretation on 06/19/2023 at 12:34 pm pst to provider JON KNAPP , who verbally acknowledged these results. IMPRESSION: Moderate right-sided pneumothorax. Electronically Signed   By: Karen Kays M.D.   On: 06/19/2023 15:39    Pending Labs Unresulted Labs (From admission, onward)    None       Vitals/Pain Today's Vitals   06/19/23 1746 06/19/23 1800 06/19/23 1815 06/19/23 1830  BP: 136/83 120/79 116/75 122/76  Pulse: (!) 121 (!) 104 (!) 107 (!) 107  Resp: (!) 26 19 16 18   Temp:      SpO2: 99% 98% 98% 98%  Weight:      Height:      PainSc:        Isolation Precautions No active isolations  Medications Medications  LORazepam (ATIVAN) injection 1 mg (has no administration in time range)  sodium chloride flush (NS) 0.9 % injection 10 mL (has no administration in time range)  ketorolac (TORADOL) 15 MG/ML injection 15 mg (15 mg Intravenous Given 06/19/23 1845)  morphine (PF) 2 MG/ML injection 2-4 mg (has no administration in time range)  LORazepam (ATIVAN) injection 1 mg (1 mg Intravenous Given 06/19/23 1716)  ketamine 50 mg in normal saline 5 mL (10 mg/mL) syringe (44 mg Intravenous Given 06/19/23 1738)  0.9 %  sodium chloride infusion ( Intravenous New Bag/Given 06/19/23 1730)  morphine (PF) 4 MG/ML injection 4 mg (4 mg Intravenous Given  06/19/23 1800)    Mobility walks     Focused Assessments Cardiac Assessment Handoff:  Cardiac Rhythm: Sinus tachycardia No results found for: "CKTOTAL", "CKMB", "CKMBINDEX", "TROPONINI" No results found for: "DDIMER" Does the Patient currently have chest pain? No   , Pulmonary Assessment Handoff:  Lung sounds: L Breath Sounds: Clear R Breath Sounds: Diminished O2 Device: Room Air      R Recommendations: See Admitting Provider Note  Report given to:   Additional Notes:   Chest tube is in place Chest tube is hooked to wall suction

## 2023-06-19 NOTE — Consult Note (Signed)
NAME:  Crystal Mcdonald, MRN:  [id number], DOB:  08/24/1996, LOS: 0 ADMISSION DATE:  06/19/2023, CONSULTATION DATE:  06/19/23  REFERRING MD:  Dr. Truddie Hidden, CHIEF COMPLAINT:  SOB   History of Present Illness:  Crystal Mcdonald is a 27 year old female who presents to to [facility] health emergency department 07/16/2019 for 5 days of shortness of breath.  Patient states that on Friday she was swimming and noticed a sudden onset of shortness of breath.  Patient was evaluated by her PCP today and was told that she had a pneumothorax and to come to the emergency department for further workup and evaluation.  On interview patient denied history of trauma MVC connective tissue disorders or any other acute complications.  Patient was very anxious on interview however no difficulty speaking and oxygen saturations were stable on room air.  PCCM was consulted for placement and management of a chest tube  Pertinent  Medical History  Depression  Significant Hospital Events: Including procedures, antibiotic start and stop dates in addition to other pertinent events     Interim History / Subjective:    Objective   Blood pressure 136/83, pulse (!) 121, temperature 98.1 F (36.7 C), resp. rate (!) 26, height 5\' 8"  (1.727 m), weight 88.9 kg, SpO2 99 %.       No intake or output data in the 24 hours ending 06/19/23 1758 Filed Weights   06/19/23 1453  Weight: 88.9 kg    Examination:   Physical Exam Constitutional:      Appearance: Normal appearance.  HENT:     Head: Normocephalic.     Nose: Nose normal.     Mouth/Throat:     Mouth: Mucous membranes are moist.     Pharynx: Oropharynx is clear.  Eyes:     Conjunctiva/sclera: Conjunctivae normal.     Pupils: Pupils are equal, round, and reactive to light.  Cardiovascular:     Rate and Rhythm: Normal rate and regular rhythm.     Pulses: Normal pulses.     Heart sounds: Normal heart sounds.  Pulmonary:     Effort: Pulmonary effort is normal.      Breath sounds: Normal breath sounds.  Abdominal:     General: Abdomen is flat. Bowel sounds are normal.     Palpations: Abdomen is soft.  Musculoskeletal:        General: Normal range of motion.     Cervical back: Normal range of motion.  Skin:    General: Skin is warm.     Capillary Refill: Capillary refill takes less than 2 seconds.  Neurological:     General: No focal deficit present.     Mental Status: She is alert and oriented to person, place, and time. Mental status is at baseline.     Comments: Anxious   Psychiatric:        Mood and Affect: Mood normal.        Thought Content: Thought content normal.     Resolved Hospital Problem list   N/A  Assessment & Plan:  Spontaneous Pneumothorax Pt states she was swimming on Friday and noticed sudden onset of SOB- CXR showed a R pneumothorax.  Etiology unclear. Chest tube was placed in ED -Chest tube to -20cm suction -Pain medication PRN Consider CT chest after resolution of pneumothorax    Best Practice (right click and "Reselect all SmartList Selections" daily)   Diet/type: Regular consistency (see orders) DVT prophylaxis: SCD GI prophylaxis: N/A Lines: N/A Foley:  N/A Code Status:  full code Last date of multidisciplinary goals of care discussion [per primary]  Labs   CBC: Recent Labs  Lab 06/19/23 1612  WBC 12.9*  HGB 13.8  HCT 40.4  MCV 89.6  PLT 344    Basic Metabolic Panel: No results for input(s): "NA", "K", "CL", "CO2", "GLUCOSE", "BUN", "CREATININE", "CALCIUM", "MG", "PHOS" in the last 168 hours. GFR: CrCl cannot be calculated (Patient's most recent lab result is older than the maximum 21 days allowed.). Recent Labs  Lab 06/19/23 1612  WBC 12.9*    Liver Function Tests: No results for input(s): "AST", "ALT", "ALKPHOS", "BILITOT", "PROT", "ALBUMIN" in the last 168 hours. No results for input(s): "LIPASE", "AMYLASE" in the last 168 hours. No results for input(s): "AMMONIA" in the last 168  hours.  ABG No results found for: "PHART", "PCO2ART", "PO2ART", "HCO3", "TCO2", "ACIDBASEDEF", "O2SAT"   Coagulation Profile: No results for input(s): "INR", "PROTIME" in the last 168 hours.  Cardiac Enzymes: No results for input(s): "CKTOTAL", "CKMB", "CKMBINDEX", "TROPONINI" in the last 168 hours.  HbA1C: No results found for: "HGBA1C"  CBG: No results for input(s): "GLUCAP" in the last 168 hours.  Review of Systems:   Negative Except as listed in POC  Past Medical History:  She,  has a past medical history of Depression.   Surgical History:  History reviewed. No pertinent surgical history.   Social History:   reports that she has never smoked. She does not have any smokeless tobacco history on file. She reports that she does not drink alcohol and does not use drugs.   Family History:  Her family history is not on file.   Allergies No Known Allergies   Home Medications  Prior to Admission medications   Medication Sig Start Date End Date Taking? Authorizing Provider  escitalopram (LEXAPRO) 10 MG tablet Take 10 mg by mouth daily. 04/16/23  Yes [provider]  Acetaminophen (TYLENOL DISSOLVE PACKS PO) Take 1 packet by mouth 2 (two) times daily as needed (pain).    [provider]  Multiple Vitamins-Minerals (WOMENS DAILY FORMULA PO) Take 2 tablets by mouth daily. gummy    [provider]     Consult time: 35 minutes    Janeece Riggers, AGACNP-BC  Pulmonary & Critical Care Medicine For pager details, please see AMION or use EPIC chat After 1900, please call ELINK for cross coverage needs 06/19/2023 6:05 PM

## 2023-06-19 NOTE — H&P (Signed)
History and Physical    Crystal Mcdonald UEA:[id number] DOB: 03-Apr-1996 DOA: 06/19/2023  PCP: Pcp, No  Patient coming from: Home  I have personally briefly reviewed patient's old medical records in [facility]  Chief Complaint: Shortness of breath   HPI: Crystal Mcdonald is a 27 y.o. female with medical history significant for depression who presented to the ED for evaluation of shortness of breath.  Patient reports 5 days of shortness of breath which when she was swimming this past Friday.  Her PCP ordered a chest x-ray today which showed a moderate right-sided pneumothorax.  Patient was advised to present to the ED for further evaluation.  Patient denies any recent obvious trauma or MVC.  She is a non-smoker.  ED Course  Labs/Imaging on admission: I have personally reviewed following labs and imaging studies.  Initial vitals showed BP 144/103, pulse 117, RR 18, temp 98.1 F, SpO2 98% on room air.  Labs showed WBC 12.9, hemoglobin 13.8, platelets 344,000, sodium 138, potassium 3.4, bicarb 20, BUN 12, creatinine 0.77, serum glucose 108.  Portable chest x-ray showed moderate right-sided pneumothorax.  PCCM were consulted.  Right-sided chest tube was placed.  Follow-up CXR showed interval placement of right chest tube with resolution of the right-sided pneumothorax.  Patient was given IV morphine, IV Ativan.  The hospitalist service was consulted to admit.  Review of Systems: All systems reviewed and are negative except as documented in history of present illness above.   Past Medical History:  Diagnosis Date   Depression     History reviewed. No pertinent surgical history.  Social History:  reports that she has never smoked. She does not have any smokeless tobacco history on file. She reports that she does not drink alcohol and does not use drugs.  No Known Allergies  History reviewed. No pertinent family history.   Prior to Admission medications   Medication  Sig Start Date End Date Taking? Authorizing Provider  escitalopram (LEXAPRO) 10 MG tablet Take 10 mg by mouth every evening. 04/16/23  Yes [provider]  Multiple Vitamins-Minerals (MULTIVITAMIN GUMMIES ADULT) CHEW Chew 2 each by mouth every evening.   Yes [provider]    Physical Exam: Vitals:   06/19/23 1830 06/19/23 1845 06/19/23 1900 06/19/23 1906  BP: 122/76 126/76 117/83   Pulse: (!) 107 (!) 106 (!) 112   Resp: 18 (!) 23 (!) 26   Temp:    98.6 F (37 C)  TempSrc:    Oral  SpO2: 98% 95% 100%   Weight:      Height:       Constitutional: Resting in bed, appears anxious and uncomfortable Eyes: EOMI, lids and conjunctivae normal ENMT: Mucous membranes are moist. Posterior pharynx clear of any exudate or lesions.Normal dentition.  Neck: normal, supple, no masses. Respiratory: clear to auscultation bilaterally, no wheezing, no crackles.  Right-sided chest tube in place. Cardiovascular: Tachycardic, no murmurs / rubs / gallops. No extremity edema. 2+ pedal pulses. Abdomen: no tenderness, no masses palpated.  Musculoskeletal: no clubbing / cyanosis. No joint deformity upper and lower extremities. Good ROM, no contractures. Normal muscle tone.  Skin: no rashes, lesions, ulcers. No induration Neurologic: Sensation intact. Strength 5/5 in all 4.  Psychiatric: Alert and oriented x 3.  Anxious mood.   EKG: Personally reviewed. Sinus tachycardia, rate 103, no acute ischemic changes.  No prior for comparison.  Assessment/Plan Principal Problem:   Spontaneous pneumothorax Active Problems:   Depression   Crystal Mcdonald  is a 27 y.o. female with medical history significant for depression who is admitted with spontaneous right-sided pneumothorax s/p right-sided chest tube placed by PCCM 7/3.  Assessment and Plan: * Spontaneous pneumothorax Felt to be a spontaneous right-sided pneumothorax.   -S/p right-sided chest tube placed by PCCM 7/3 -Continue analgesics,  supplemental O2 -Further management as per PCCM  Depression Continue Lexapro.  DVT prophylaxis: SCDs Start: 06/19/23 1906 Code Status: Full code Family Communication: Mother at bedside Disposition Plan: From home and likely discharge to home pending clinical progress Consults called: PCCM Severity of Illness: The appropriate patient status for this patient is INPATIENT. Inpatient status is judged to be reasonable and necessary in order to provide the required intensity of service to ensure the patient's safety. The patient's presenting symptoms, physical exam findings, and initial radiographic and laboratory data in the context of their chronic comorbidities is felt to place them at high risk for further clinical deterioration. Furthermore, it is not anticipated that the patient will be medically stable for discharge from the hospital within 2 midnights of admission.   * I certify that at the point of admission it is my clinical judgment that the patient will require inpatient hospital care spanning beyond 2 midnights from the point of admission due to high intensity of service, high risk for further deterioration and high frequency of surveillance required.Darreld Mclean MD [facility]  If 7PM-7AM, please contact night-coverage [email or web address]  06/19/2023, 7:11 PM

## 2023-06-19 NOTE — Assessment & Plan Note (Signed)
Felt to be a spontaneous right-sided pneumothorax.   -S/p right-sided chest tube placed by PCCM 7/3 -Continue analgesics, supplemental O2 -Further management as per PCCM

## 2023-06-19 NOTE — Assessment & Plan Note (Signed)
Continue Lexapro

## 2023-06-19 NOTE — Hospital Course (Signed)
Crystal Mcdonald is a 27 y.o. female with medical history significant for depression who is admitted with spontaneous right-sided pneumothorax s/p right-sided chest tube placed by PCCM 7/3.

## 2023-06-19 NOTE — ED Notes (Signed)
Pt provided with incentive spirometer and educated on proper use. Pt able to demonstrate competency.

## 2023-06-19 NOTE — ED Triage Notes (Signed)
Patient arrives ambulatory by POV states her PCP sent her here for pneumothorax to right side. Patient states she saw her doctor due to having shortness of breath since Friday. Patient speaking in full sentences.

## 2023-06-19 NOTE — Procedures (Signed)
Insertion of Chest Tube Procedure Note  Crystal Mcdonald  [id number]  March 03, 1996  Date:06/19/23  Time:5:59 PM    Provider Performing: Shelby Mattocks   Procedure: Chest Tube Insertion [phone])  Indication(s) Pneumothorax  Consent Risks of the procedure as well as the alternatives and risks of each were explained to the patient and/or caregiver.  Consent for the procedure was obtained and is signed in the bedside chart  Anesthesia Topical only with 1% lidocaine  0.5mg /kg Ketamine after 2 mg IV ativan   Time Out Verified patient identification, verified procedure, site/side was marked, verified correct patient position, special equipment/implants available, medications/allergies/relevant history reviewed, required imaging and test results available.   Sterile Technique Maximal sterile technique including full sterile barrier drape, hand hygiene, sterile gown, sterile gloves, mask, hair covering, sterile ultrasound probe cover (if used).   Procedure Description Ultrasound not used to identify appropriate pleural anatomy for placement and overlying skin marked. Area of placement cleaned and draped in sterile fashion.  A 14 French chest tube was placed into the right pleural space using Seldinger technique. Appropriate return of air was obtained.  The tube was connected to atrium and placed on -20 cm H2O wall suction.   Complications/Tolerance None; patient tolerated the procedure well. Chest X-ray is ordered to verify placement.   EBL Minimal  Specimen(s) none   [name] ACNP-BC [facility] Pulmonary/Critical Care Pager # [phone] OR # [phone] if no answer

## 2023-06-19 NOTE — ED Provider Notes (Signed)
[facility] AT [facility] Provider Note   CSN: [id number] Arrival date & time: 06/19/23  1443     History  Chief Complaint  Patient presents with   Pneumothorax    Crystal Mcdonald is a 27 y.o. female presents with 5 days of shortness of breath.  She was evaluated by her PCP today and told she had a pneumothorax and to come to the ER.  She denies any history of trauma, MVC, connective tissue disorders. She is having difficulty speaking at her normal speed due to the shortness of breath, but is able to talk in short sentences. She denies any other symptoms.  HPI     Home Medications Prior to Admission medications   Medication Sig Start Date End Date Taking? Authorizing Provider  escitalopram (LEXAPRO) 10 MG tablet Take 10 mg by mouth every evening. 04/16/23  Yes [provider]  Multiple Vitamins-Minerals (MULTIVITAMIN GUMMIES ADULT) CHEW Chew 2 each by mouth every evening.   Yes [provider]      Allergies    Patient has no known allergies.    Review of Systems   Review of Systems  Constitutional:  Negative for chills and fever.  Respiratory:  Positive for shortness of breath.     Physical Exam Updated Vital Signs BP 122/76   Pulse (!) 107   Temp 98.1 F (36.7 C)   Resp 18   Ht 5\' 8"  (1.727 m)   Wt 88.9 kg   SpO2 98%   BMI 29.80 kg/m  Physical Exam Vitals and nursing note reviewed.  Constitutional:      Comments: Somewhat anxious appearing but in no acute distress  Cardiovascular:     Rate and Rhythm: Regular rhythm. Tachycardia present.     Heart sounds: No murmur heard. Pulmonary:     Effort: Pulmonary effort is normal.     Comments: Decreased breath sounds in the right upper lobe Normal breath sounds in the right middle and lower lobe and left lobe diffusely Musculoskeletal:        General: No deformity.  Neurological:     General: No focal deficit present.     Mental Status: She is alert.   Psychiatric:        Mood and Affect: Mood normal.     ED Results / Procedures / Treatments   Labs (all labs ordered are listed, but only abnormal results are displayed) Labs Reviewed  CBC - Abnormal; Notable for the following components:      Result Value   WBC 12.9 (*)    All other components within normal limits  BASIC METABOLIC PANEL - Abnormal; Notable for the following components:   Potassium 3.4 (*)    CO2 20 (*)    Glucose, Bld 108 (*)    All other components within normal limits    EKG None  Radiology DG Chest Portable 1 View  Result Date: 06/19/2023 CLINICAL DATA:  Chest tube placement. EXAM: PORTABLE CHEST 1 VIEW COMPARISON:  06/19/2023, earlier same day FINDINGS: Interval placement of right chest tube with resolution of the right sided pneumothorax. No pleural line visible on the current study is suggests residual right-sided pleural gas. Left lung remains clear. The cardiopericardial silhouette is within normal limits for size. Telemetry leads overlie the chest. IMPRESSION: Interval placement of right chest tube with resolution of the right-sided pneumothorax. Electronically Signed   By: Kennith Center M.D.   On: 06/19/2023 18:21   DG Chest Portable 1  View  Result Date: 06/19/2023 CLINICAL DATA:  Pneumothorax EXAM: PORTABLE CHEST 1 VIEW COMPARISON:  None Available. FINDINGS: Moderate right-sided apical pneumothorax. Pneumothorax line also extends laterally along the right hemithorax. Maximal distance from visceral to parietal pleura proximally 4.5 cm. Otherwise no consolidation or effusion. No edema. No left-sided pneumothorax. Normal cardiopericardial silhouette. Critical Value/emergent results were called by telephone at the time of interpretation on 06/19/2023 at 12:34 pm pst to provider JON KNAPP , who verbally acknowledged these results. IMPRESSION: Moderate right-sided pneumothorax. Electronically Signed   By: Karen Kays M.D.   On: 06/19/2023 15:39     Procedures Procedures    Medications Ordered in ED Medications  LORazepam (ATIVAN) injection 1 mg (has no administration in time range)  sodium chloride flush (NS) 0.9 % injection 10 mL (has no administration in time range)  ketorolac (TORADOL) 15 MG/ML injection 15 mg (15 mg Intravenous Given 06/19/23 1845)  morphine (PF) 2 MG/ML injection 2-4 mg (has no administration in time range)  LORazepam (ATIVAN) injection 1 mg (1 mg Intravenous Given 06/19/23 1716)  ketamine 50 mg in normal saline 5 mL (10 mg/mL) syringe (44 mg Intravenous Given 06/19/23 1738)  0.9 %  sodium chloride infusion ( Intravenous New Bag/Given 06/19/23 1730)  morphine (PF) 4 MG/ML injection 4 mg (4 mg Intravenous Given 06/19/23 1800)    ED Course/ Medical Decision Making/ A&P Clinical Course as of 06/19/23 1900  Wed Jun 19, 2023  1522 Looks uncomfortable. Questionably diminished on the right. [CP]    Clinical Course User Index [CP] Olene Floss, PA-C                             Medical Decision Making Amount and/or Complexity of Data Reviewed Radiology: ordered.  Risk Prescription drug management. Decision regarding hospitalization.   27 y.o. female presents to the ED for concern of 5 days of shortness of breath, with concern for pneumothorax  Differential diagnosis includes but is not limited to pneumothorax, bronchitis, pneumonia, asthma exacerbation  ED Course:  Patient was evaluated by her PCP and told she had a pneumothorax and to come to the ER.  Upon evaluation in the ER, she is anxious appearing and talking in short sentences.  She has some tachycardia, but otherwise starting well on room air with a normal respiratory rate.  X-ray here in the ER shows moderate right-sided pneumothorax.  No signs of tension pneumothorax at this time. This is likely a spontaneous pneumothorax as she denies any history of trauma, MVC.  Low suspicion for bronchitis, pneumonia, asthma at this time given lack of other  symptoms such as fever, chills, cough, wheezing.  Chest tube placed by critical care/pulmonology team. They recommend she be admitted hospital service.  Hospitalist consulted and they plan to admit patient.  Patient had some pain after the procedure, she was given morphine with improvement in pain  Impression: Right-sided pneumothorax  Disposition:  Admitted to hospitalist service    Lab Tests: I Ordered, and personally interpreted labs.  The pertinent results include:   CBC with slight leukocytosis of 12.9 CMP with slightly low potassium at 3.4  Imaging Studies ordered: I ordered imaging studies including CT chest I independently visualized the imaging with scope of interpretation limited to determining acute life threatening conditions related to emergency care. Imaging showed right-sided pneumothorax.  No pleural effusion or consolidations. I agree with the radiologist interpretation   Cardiac Monitoring: / EKG: The patient was  maintained on a cardiac monitor.  I personally viewed and interpreted the cardiac monitored which showed an underlying rhythm of:  Tachycardia   Consultations Obtained: I requested consultation with the pulmonologist Zenia Resides and discussed lab and imaging findings as well as pertinent plan - they will come down to evaluate the patient to provide further recommendations. I requested consultation with the hospitalist Dr. Allena Katz and discussed lab and imaging findings as well as pertinent plan - they will come down to evaluate the patient to provide further recommendations.  External records from outside source obtained and reviewed including EMR record to look at earlier CXR, however, was not able to locate   Co morbidities that complicate the patient evaluation  Anxiety  Social Determinants of Health:  Unknown             Final Clinical Impression(s) / ED Diagnoses Final diagnoses:  Pneumothorax, unspecified type    Rx / DC  Orders ED Discharge Orders     None         Arabella Merles, Cordelia Poche 06/19/23 1900    Linwood Dibbles, MD 06/20/23 [phone]

## 2023-06-20 ENCOUNTER — Inpatient Hospital Stay (HOSPITAL_COMMUNITY): Payer: BC Managed Care – PPO

## 2023-06-20 DIAGNOSIS — J9383 Other pneumothorax: Secondary | ICD-10-CM | POA: Diagnosis not present

## 2023-06-20 LAB — BASIC METABOLIC PANEL
Anion gap: 10 (ref 5–15)
BUN: 8 mg/dL (ref 6–20)
CO2: 20 mmol/L — ABNORMAL LOW (ref 22–32)
Calcium: 8.8 mg/dL — ABNORMAL LOW (ref 8.9–10.3)
Chloride: 106 mmol/L (ref 98–111)
Creatinine, Ser: 0.66 mg/dL (ref 0.44–1.00)
GFR, Estimated: >60 mL/min (ref >60)
Glucose, Bld: 125 mg/dL — ABNORMAL HIGH (ref 70–99)
Potassium: 4.1 mmol/L (ref 3.5–5.1)
Sodium: 136 mmol/L (ref 135–145)

## 2023-06-20 LAB — CBC
HCT: 38.5 % (ref 36.0–46.0)
Hemoglobin: 13.2 g/dL (ref 12.0–15.0)
MCH: 30.9 pg (ref 26.0–34.0)
MCHC: 34.3 g/dL (ref 30.0–36.0)
MCV: 90.2 fL (ref 80.0–100.0)
Platelets: 312 10*3/uL (ref 150–400)
RBC: 4.27 MIL/uL (ref 3.87–5.11)
RDW: 12.1 % (ref 11.5–15.5)
WBC: 10.3 10*3/uL (ref 4.0–10.5)
nRBC: 0 % (ref 0.0–0.2)

## 2023-06-20 LAB — HIV ANTIBODY (ROUTINE TESTING W REFLEX): HIV Screen 4th Generation wRfx: NONREACTIVE

## 2023-06-20 MED ORDER — ZOLPIDEM TARTRATE 5 MG PO TABS
5.0000 mg | ORAL_TABLET | Freq: Every evening | ORAL | Status: DC | PRN
  Administered 2023-06-20: 5 mg via ORAL
  Filled 2023-06-20 (×2): qty 1

## 2023-06-20 MED ORDER — ACETAMINOPHEN 325 MG PO TABS
650.0000 mg | ORAL_TABLET | Freq: Four times a day (QID) | ORAL | Status: AC
  Administered 2023-06-21: 650 mg via ORAL
  Filled 2023-06-20 (×3): qty 2

## 2023-06-20 NOTE — Progress Notes (Signed)
NAME:  Crystal Mcdonald, MRN:  [id number], DOB:  Apr 01, 1996, LOS: 1 ADMISSION DATE:  06/19/2023, CONSULTATION DATE: 06/19/2023 REFERRING MD: Dr. Truddie Hidden, CHIEF COMPLAINT: Shortness of breath  History of Present Illness:  Crystal Mcdonald is a 27 year old female who presents to to [facility] health emergency department 07/16/2019 for 5 days of shortness of breath.  Patient states that on Friday she was swimming and noticed a sudden onset of shortness of breath.  Patient was evaluated by her PCP today and was told that she had a pneumothorax and to come to the emergency department for further workup and evaluation.  On interview patient denied history of trauma MVC connective tissue disorders or any other acute complications.  Patient was very anxious on interview however no difficulty speaking and oxygen saturations were stable on room air.   PCCM was consulted for placement and management of a chest tube  Pertinent  Medical History   Past Medical History:  Diagnosis Date   Depression      Significant Hospital Events: Including procedures, antibiotic start and stop dates in addition to other pertinent events   06/19/2023-chest tube placement  Interim History / Subjective:  No overnight events, pain appears controlled  Objective   Blood pressure 121/83, pulse 100, temperature 98.1 F (36.7 C), temperature source Oral, resp. rate 20, height 5\' 8"  (1.727 m), weight 88.9 kg, SpO2 98 %.        Intake/Output Summary (Last 24 hours) at 06/20/2023 1231 Last data filed at 06/20/2023 0700 Gross per 24 hour  Intake 373 ml  Output 510 ml  Net -137 ml   Filed Weights   06/19/23 1453  Weight: 88.9 kg    Examination: General: Young lady does not appear to be in distress HENT: Moist oral mucosa Lungs: Clear breath sounds Cardiovascular: S1-S2 appreciated Abdomen: Soft, bowel sounds appreciated Extremities: No clubbing, no edema Neuro: Alert and oriented x 3 GU:   Resolved Hospital Problem list      Assessment & Plan:  Primary spontaneous pneumothorax  Chest x-ray reviewed today showing no pneumothorax -No flocculation in tube and no airleak -Good air entry bilaterally  Lungs likely have sealed  Will get chest x-ray in a.m. -Plan will be to clamp chest tube for about an hour and if no pneumothorax, chest tube can be discontinued  Potentially patient may be dischargeable 06/21/2023  Some concern for underlying pain -Scheduled Tylenol for 24 hours  Best Practice (right click and "Reselect all SmartList Selections" daily)   Diet/type: Regular consistency (see orders) DVT prophylaxis: SCD GI prophylaxis: N/A Lines: N/A Foley:  N/A Code Status:  full code Last date of multidisciplinary goals of care discussion [discussed with patient and family at bedside]  Labs   CBC: Recent Labs  Lab 06/19/23 1612 06/20/23 0100  WBC 12.9* 10.3  HGB 13.8 13.2  HCT 40.4 38.5  MCV 89.6 90.2  PLT 344 312    Basic Metabolic Panel: Recent Labs  Lab 06/19/23 1612 06/20/23 0100  NA 138 136  K 3.4* 4.1  CL 108 106  CO2 20* 20*  GLUCOSE 108* 125*  BUN 12 8  CREATININE 0.77 0.66  CALCIUM 9.6 8.8*   GFR: Estimated Creatinine Clearance: 123.2 mL/min (by C-G formula based on SCr of 0.66 mg/dL). Recent Labs  Lab 06/19/23 1612 06/20/23 0100  WBC 12.9* 10.3    Liver Function Tests: No results for input(s): "AST", "ALT", "ALKPHOS", "BILITOT", "PROT", "ALBUMIN" in the last 168 hours. No results for  input(s): "LIPASE", "AMYLASE" in the last 168 hours. No results for input(s): "AMMONIA" in the last 168 hours.  ABG No results found for: "PHART", "PCO2ART", "PO2ART", "HCO3", "TCO2", "ACIDBASEDEF", "O2SAT"   Coagulation Profile: No results for input(s): "INR", "PROTIME" in the last 168 hours.  Cardiac Enzymes: No results for input(s): "CKTOTAL", "CKMB", "CKMBINDEX", "TROPONINI" in the last 168 hours.  HbA1C: No results found for: "HGBA1C"  CBG: No results for input(s):  "GLUCAP" in the last 168 hours.  Review of Systems:   Breathing feels good  Past Medical History:  She,  has a past medical history of Depression.   Surgical History:  History reviewed. No pertinent surgical history.   Social History:   reports that she has never smoked. She does not have any smokeless tobacco history on file. She reports that she does not drink alcohol and does not use drugs.   Family History:  Her family history is not on file.   Allergies No Known Allergies   Virl Diamond, MD [place] [facility] Pager: See Loretha Stapler

## 2023-06-20 NOTE — Progress Notes (Signed)
PROGRESS NOTE    Crystal Mcdonald  UJW:[id number] DOB: 10-Mar-1996 DOA: 06/19/2023 PCP: Oneita Hurt, No   Brief Narrative:  Crystal Mcdonald is a 27 y.o. female with medical history significant for depression who presented to the ED for evaluation of shortness of breath.  Imaging revealed pneumothorax, presumed to be spontaneous given no clear trauma or etiology for its origin.  Hospitalist called for admission, pulmonology consulted for chest tube placement.   Assessment & Plan:   Principal Problem:   Spontaneous pneumothorax Active Problems:   Depression  Spontaneous pneumothorax, right -No clear indication or etiology -Pulmonology following, appreciate insight recommendations -Plan for transition off vacuum to waterseal today with subsequent clamping tomorrow morning, if pneumothorax remains stable/resolved reasonable for discharge per discussion with pulmonology -Without hypoxia, patient is pulling appropriate lung volumes on incentive spirometry with only slight pleuritic chest pain and insertion site of chest tube -Pneumothorax not identifiable on most recent imaging  Depression Continue Lexapro.   DVT prophylaxis: Early ambulation, low risk Code Status: Full Family Communication: Multiple members at bedside  Status is: Inpatient  Dispo: The patient is from: Home              Anticipated d/c is to: Home              Anticipated d/c date is: 24 to 48 hours pending clinical course              Patient currently not medically stable for discharge at this time due to ongoing need for chest tube insertion and medical care  Consultants:  PCCM  Procedures:  Right chest tube placement 06/19/2023  Antimicrobials:  None indicated  Subjective: No acute issues or events overnight, chest pain around chest tube appears to be improving otherwise denies nausea vomiting diarrhea constipation headache fevers chills  Objective: Vitals:   06/19/23 2013 06/20/23 0023 06/20/23 0442 06/20/23  0734  BP: 116/71 119/68 105/66 105/71  Pulse:  98 81 81  Resp: 16 17 18 16   Temp: 98.1 F (36.7 C) 98.4 F (36.9 C) 98.8 F (37.1 C) 98.5 F (36.9 C)  TempSrc: Oral Oral Oral Oral  SpO2:  94% 99% 98%  Weight:      Height:        Intake/Output Summary (Last 24 hours) at 06/20/2023 0828 Last data filed at 06/20/2023 0700 Gross per 24 hour  Intake 373 ml  Output 510 ml  Net -137 ml   Filed Weights   06/19/23 1453  Weight: 88.9 kg    Examination:  General:  Pleasantly resting in bed, No acute distress. HEENT:  Normocephalic atraumatic.  Sclerae nonicteric, noninjected.  Extraocular movements intact bilaterally. Neck:  Without mass or deformity.  Trachea is midline. Lungs:  Clear to auscultate bilaterally without rhonchi, wheeze, or rales.  Right sided chest tube to suction Heart:  Regular rate and rhythm.  Without murmurs, rubs, or gallops. Abdomen:  Soft, nontender, nondistended.  Without guarding or rebound. Extremities: Without cyanosis, clubbing, edema, or obvious deformity. Skin:  Warm and dry, no erythema.   Data Reviewed: I have personally reviewed following labs and imaging studies  CBC: Recent Labs  Lab 06/19/23 1612 06/20/23 0100  WBC 12.9* 10.3  HGB 13.8 13.2  HCT 40.4 38.5  MCV 89.6 90.2  PLT 344 312   Basic Metabolic Panel: Recent Labs  Lab 06/19/23 1612 06/20/23 0100  NA 138 136  K 3.4* 4.1  CL 108 106  CO2 20* 20*  GLUCOSE 108* 125*  BUN 12 8  CREATININE 0.77 0.66  CALCIUM 9.6 8.8*    No results found for this or any previous visit (from the past 240 hour(s)).   Radiology Studies: DG Chest Portable 1 View  Result Date: 06/19/2023 CLINICAL DATA:  Chest tube placement. EXAM: PORTABLE CHEST 1 VIEW COMPARISON:  06/19/2023, earlier same day FINDINGS: Interval placement of right chest tube with resolution of the right sided pneumothorax. No pleural line visible on the current study is suggests residual right-sided pleural gas. Left lung remains  clear. The cardiopericardial silhouette is within normal limits for size. Telemetry leads overlie the chest. IMPRESSION: Interval placement of right chest tube with resolution of the right-sided pneumothorax. Electronically Signed   By: Kennith Center M.D.   On: 06/19/2023 18:21   DG Chest Portable 1 View  Result Date: 06/19/2023 CLINICAL DATA:  Pneumothorax EXAM: PORTABLE CHEST 1 VIEW COMPARISON:  None Available. FINDINGS: Moderate right-sided apical pneumothorax. Pneumothorax line also extends laterally along the right hemithorax. Maximal distance from visceral to parietal pleura proximally 4.5 cm. Otherwise no consolidation or effusion. No edema. No left-sided pneumothorax. Normal cardiopericardial silhouette. Critical Value/emergent results were called by telephone at the time of interpretation on 06/19/2023 at 12:34 pm pst to provider JON KNAPP , who verbally acknowledged these results. IMPRESSION: Moderate right-sided pneumothorax. Electronically Signed   By: Karen Kays M.D.   On: 06/19/2023 15:39    Scheduled Meds:  escitalopram  10 mg Oral QPM   ketorolac  15 mg Intravenous Q6H   potassium chloride  40 mEq Oral Daily   sodium chloride flush  10 mL Intrapleural Q8H   sodium chloride flush  3 mL Intravenous Q12H   Continuous Infusions:   LOS: 1 day   Time spent:  Azucena Fallen, DO [facility]  If 7PM-7AM, please contact night-coverage [email or web address]  06/20/2023, 8:28 AM

## 2023-06-20 NOTE — Plan of Care (Signed)

## 2023-06-21 ENCOUNTER — Inpatient Hospital Stay (HOSPITAL_COMMUNITY): Payer: BC Managed Care – PPO

## 2023-06-21 DIAGNOSIS — J9383 Other pneumothorax: Secondary | ICD-10-CM | POA: Diagnosis not present

## 2023-06-21 NOTE — Progress Notes (Signed)
NAME:  Crystal Mcdonald, MRN:  [id number], DOB:  04/02/1996, LOS: 2 ADMISSION DATE:  06/19/2023, CONSULTATION DATE: 06/19/2023 REFERRING MD: Dr. Truddie Hidden, CHIEF COMPLAINT: Shortness of breath  History of Present Illness:  Crystal Mcdonald is a 27 year old female who presents to to [facility] health emergency department 07/16/2019 for 5 days of shortness of breath.  Patient states that on Friday she was swimming and noticed a sudden onset of shortness of breath.  Patient was evaluated by her PCP today and was told that she had a pneumothorax and to come to the emergency department for further workup and evaluation.  On interview patient denied history of trauma MVC connective tissue disorders or any other acute complications.  Patient was very anxious on interview however no difficulty speaking and oxygen saturations were stable on room air.   PCCM was consulted for placement and management of a chest tube  Pertinent  Medical History   Past Medical History:  Diagnosis Date   Depression      Significant Hospital Events: Including procedures, antibiotic start and stop dates in addition to other pertinent events   06/19/2023-chest tube placement 06/21/2023 chest tube removal  Interim History / Subjective:  No airleak plan to remove chest tube  Objective   Blood pressure 108/75, pulse 84, temperature 98.6 F (37 C), temperature source Oral, resp. rate 18, height 5\' 8"  (1.727 m), weight 88.9 kg, SpO2 99 %.        Intake/Output Summary (Last 24 hours) at 06/21/2023 0930 Last data filed at 06/20/2023 2000 Gross per 24 hour  Intake 10 ml  Output 10 ml  Net 0 ml   Filed Weights   06/19/23 1453  Weight: 88.9 kg    Examination: Awake alert no acute distress No JVD lymphadenopathy is appreciated Chest clear to auscultation Heart sounds are regular, right chest tube without airleak Abdomen soft nontender positive bowel sounds Extremities warm and dry  Resolved Hospital Problem list      Assessment & Plan:  Primary spontaneous pneumothorax  Chest tube was inserted with resolution of pneumothorax No airleak is noted 06/21/2023 Will discontinue chest tube and get repeat chest x-ray at 12 noon today 06/21/2023 Best Practice (right click and "Reselect all SmartList Selections" daily)   Per primary Labs   CBC: Recent Labs  Lab 06/19/23 1612 06/20/23 0100  WBC 12.9* 10.3  HGB 13.8 13.2  HCT 40.4 38.5  MCV 89.6 90.2  PLT 344 312    Basic Metabolic Panel: Recent Labs  Lab 06/19/23 1612 06/20/23 0100  NA 138 136  K 3.4* 4.1  CL 108 106  CO2 20* 20*  GLUCOSE 108* 125*  BUN 12 8  CREATININE 0.77 0.66  CALCIUM 9.6 8.8*   GFR: Estimated Creatinine Clearance: 123.2 mL/min (by C-G formula based on SCr of 0.66 mg/dL). Recent Labs  Lab 06/19/23 1612 06/20/23 0100  WBC 12.9* 10.3    Liver Function Tests: No results for input(s): "AST", "ALT", "ALKPHOS", "BILITOT", "PROT", "ALBUMIN" in the last 168 hours. No results for input(s): "LIPASE", "AMYLASE" in the last 168 hours. No results for input(s): "AMMONIA" in the last 168 hours.  ABG No results found for: "PHART", "PCO2ART", "PO2ART", "HCO3", "TCO2", "ACIDBASEDEF", "O2SAT"   Coagulation Profile: No results for input(s): "INR", "PROTIME" in the last 168 hours.  Cardiac Enzymes: No results for input(s): "CKTOTAL", "CKMB", "CKMBINDEX", "TROPONINI" in the last 168 hours.  HbA1C: No results found for: "HGBA1C"  CBG: No results for input(s): "GLUCAP" in the  last 168 hours.  Crystal Mcdonald ACNP Acute Care Nurse Practitioner [facility] Pul[facility]/Critical [facility] Please consult Amion 06/21/2023, 9:30 AM

## 2023-06-21 NOTE — Discharge Summary (Signed)
Physician Discharge Summary  Crystal Mcdonald:[id number] DOB: 1996/10/04 DOA: 06/19/2023  PCP: Pcp, No  Admit date: 06/19/2023 Discharge date: 06/21/2023  Admitted From: Home Disposition:  Home  Recommendations for Outpatient Follow-up:  Follow up with PCP as previously scheduled  Home Health: None Equipment/Devices: None  Discharge Condition: Stable CODE STATUS: Full Diet recommendation: Regular  Brief/Interim Summary: Crystal Mcdonald is a 27 y.o. female with medical history significant for depression who presented to the ED for evaluation of shortness of breath.  Imaging revealed pneumothorax, presumed to be spontaneous given no clear trauma or etiology for its origin.  Hospitalist called for admission, pulmonology consulted for chest tube placement.   Patient admitted as above with spontaneous pneumothorax, no clear etiology.  Pulmonology consulted for chest tube placement.  Patient had uneventful hospitalization, weaned from vacuum seal to waterseal ultimately clamped and chest tube removed this morning.  Repeat chest x-ray shows continued inflation of right lung with no further symptoms.  Patient otherwise stable and agreeable for discharge home.  No necessary follow-up at this time with pulmonology however if symptoms recur patient is encouraged to report back to the hospital for further evaluation and may need further procedure if this becomes a recurrent issue.  Discharge Diagnoses:  Principal Problem:   Spontaneous pneumothorax Active Problems:   Depression    Discharge Instructions   Allergies as of 06/21/2023   No Known Allergies      Medication List     TAKE these medications    escitalopram 10 MG tablet Commonly known as: LEXAPRO Take 10 mg by mouth every evening.   Multivitamin Gummies Adult Chew Chew 2 each by mouth every evening.        No Known Allergies  Consultations: Pulmonology  Procedures/Studies: DG CHEST PORT 1 VIEW  Result Date:  06/20/2023 CLINICAL DATA:  History of right pneumothorax with chest tube in place EXAM: PORTABLE CHEST 1 VIEW COMPARISON:  Multiple chest x-rays, most recently June 20, 2023 at 0536 hours FINDINGS: The cardiomediastinal silhouette is unchanged in contour. Stable positioning of the right chest wall pigtail catheter. No visible pneumothorax. No focal pulmonary opacity or pleural effusion. The visualized upper abdomen is unremarkable. No acute osseous abnormality. IMPRESSION: Stable positioning of the right-sided chest tube without a visible pneumothorax. Electronically Signed   By: Jacob Moores M.D.   On: 06/20/2023 17:54   DG Chest Port 1 View  Result Date: 06/20/2023 CLINICAL DATA:  Pneumothorax EXAM: PORTABLE CHEST 1 VIEW COMPARISON:  Chest x-ray dated June 19, 2023 FINDINGS: Cardiac and mediastinal contours are within normal limits. Stable position of right-sided chest tube. No visible pneumothorax. Mild bibasilar opacities which are likely due to atelectasis. No evidence of pleural effusion. IMPRESSION: 1. Stable position of right-sided chest tube. No visible pneumothorax. 2. Mild bibasilar opacities which are likely due to atelectasis. Electronically Signed   By: Allegra Lai M.D.   On: 06/20/2023 09:27   DG Chest Portable 1 View  Result Date: 06/19/2023 CLINICAL DATA:  Chest tube placement. EXAM: PORTABLE CHEST 1 VIEW COMPARISON:  06/19/2023, earlier same day FINDINGS: Interval placement of right chest tube with resolution of the right sided pneumothorax. No pleural line visible on the current study is suggests residual right-sided pleural gas. Left lung remains clear. The cardiopericardial silhouette is within normal limits for size. Telemetry leads overlie the chest. IMPRESSION: Interval placement of right chest tube with resolution of the right-sided pneumothorax. Electronically Signed   By: Kennith Center M.D.   On:  06/19/2023 18:21   DG Chest Portable 1 View  Result Date: 06/19/2023 CLINICAL  DATA:  Pneumothorax EXAM: PORTABLE CHEST 1 VIEW COMPARISON:  None Available. FINDINGS: Moderate right-sided apical pneumothorax. Pneumothorax line also extends laterally along the right hemithorax. Maximal distance from visceral to parietal pleura proximally 4.5 cm. Otherwise no consolidation or effusion. No edema. No left-sided pneumothorax. Normal cardiopericardial silhouette. Critical Value/emergent results were called by telephone at the time of interpretation on 06/19/2023 at 12:34 pm pst to provider JON KNAPP , who verbally acknowledged these results. IMPRESSION: Moderate right-sided pneumothorax. Electronically Signed   By: Karen Kays M.D.   On: 06/19/2023 15:39     Subjective: No acute issues or events overnight denies nausea vomiting diarrhea constipation headache fevers chills or chest pain   Discharge Exam: Vitals:   06/21/23 0400 06/21/23 0804  BP: 116/72 108/75  Pulse: 70 84  Resp:  18  Temp: 98.2 F (36.8 C) 98.6 F (37 C)  SpO2: 98% 99%   Vitals:   06/20/23 2000 06/21/23 0024 06/21/23 0400 06/21/23 0804  BP:  114/77 116/72 108/75  Pulse:  66 70 84  Resp:  12  18  Temp:  98.2 F (36.8 C) 98.2 F (36.8 C) 98.6 F (37 C)  TempSrc:  Oral Oral Oral  SpO2: 98% 98% 98% 99%  Weight:      Height:        General: Pt is alert, awake, not in acute distress Cardiovascular: RRR, S1/S2 +, no rubs, no gallops Respiratory: CTA bilaterally, no wheezing, no rhonchi.  Right posterolateral chest tube clamped, insertion site clean dry intact Abdominal: Soft, NT, ND, bowel sounds + Extremities: no edema, no cyanosis    The results of significant diagnostics from this hospitalization (including imaging, microbiology, ancillary and laboratory) are listed below for reference.     Microbiology: No results found for this or any previous visit (from the past 240 hour(s)).   Labs:  Basic Metabolic Panel: Recent Labs  Lab 06/19/23 1612 06/20/23 0100  NA 138 136  K 3.4* 4.1  CL  108 106  CO2 20* 20*  GLUCOSE 108* 125*  BUN 12 8  CREATININE 0.77 0.66  CALCIUM 9.6 8.8*    CBC: Recent Labs  Lab 06/19/23 1612 06/20/23 0100  WBC 12.9* 10.3  HGB 13.8 13.2  HCT 40.4 38.5  MCV 89.6 90.2  PLT 344 312    Urinalysis    Component Value Date/Time   COLORURINE YELLOW 02/02/2022 1618   APPEARANCEUR CLEAR 02/02/2022 1618   LABSPEC 1.012 02/02/2022 1618   PHURINE 5.0 02/02/2022 1618   GLUCOSEU NEGATIVE 02/02/2022 1618   HGBUR SMALL (A) 02/02/2022 1618   BILIRUBINUR NEGATIVE 02/02/2022 1618   KETONESUR NEGATIVE 02/02/2022 1618   PROTEINUR NEGATIVE 02/02/2022 1618   NITRITE NEGATIVE 02/02/2022 1618   LEUKOCYTESUR TRACE (A) 02/02/2022 1618   Sepsis Labs Recent Labs  Lab 06/19/23 1612 06/20/23 0100  WBC 12.9* 10.3   Microbiology No results found for this or any previous visit (from the past 240 hour(s)).   Time coordinating discharge: Over 30 minutes  SIGNED:   Azucena Fallen, DO [facility] 06/21/2023, 8:22 AM Pager   If 7PM-7AM, please contact night-coverage [email or web address]

## 2023-06-21 NOTE — TOC Transition Note (Signed)
Transition of Care [facility]) - CM/SW Discharge Note   Patient Details  Name: Crystal Mcdonald MRN: [id number] Date of Birth: 28-Jun-1996  [facility]) CM/SW Contact:  Kermit Balo, RN Phone Number: 06/21/2023, 11:16 AM   Clinical Narrative:    Pt is discharging home with self care. She lives with grandfather that can provide supervision. She manages her own medications without any issues.  Family at bedside providing transportation home today.   Final next level of care: Home/Self Care Barriers to Discharge: No Barriers Identified   Patient Goals and CMS Choice      Discharge Placement                         Discharge Plan and Services Additional resources added to the After Visit Summary for                                       Social Determinants of Health (SDOH) Interventions SDOH Screenings   Food Insecurity: No Food Insecurity (06/19/2023)  Housing: Low Risk  (06/19/2023)  Transportation Needs: No Transportation Needs (06/19/2023)  Utilities: Not At Risk (06/19/2023)  Tobacco Use: Unknown (06/19/2023)     Readmission Risk Interventions     No data to display

## 2023-08-29 ENCOUNTER — Ambulatory Visit (INDEPENDENT_AMBULATORY_CARE_PROVIDER_SITE_OTHER): Payer: BC Managed Care – PPO

## 2023-08-29 ENCOUNTER — Encounter: Payer: Self-pay | Admitting: Primary Care

## 2023-08-29 ENCOUNTER — Ambulatory Visit (INDEPENDENT_AMBULATORY_CARE_PROVIDER_SITE_OTHER): Payer: BC Managed Care – PPO | Admitting: Primary Care

## 2023-08-29 VITALS — BP 112/78 | HR 82 | Ht 68.0 in | Wt 197.0 lb

## 2023-08-29 DIAGNOSIS — R0600 Dyspnea, unspecified: Secondary | ICD-10-CM | POA: Insufficient documentation

## 2023-08-29 DIAGNOSIS — J9383 Other pneumothorax: Secondary | ICD-10-CM | POA: Diagnosis not present

## 2023-08-29 DIAGNOSIS — R0602 Shortness of breath: Secondary | ICD-10-CM

## 2023-08-29 NOTE — Assessment & Plan Note (Signed)
-   Improved; Continue to have intermittent dyspnea symptoms. No associated chest tightness, wheezing or cough. Reports clinical benefit from SABA. Recommend getting pulmonary function testing in 1 month.

## 2023-08-29 NOTE — Progress Notes (Signed)
@Patient  ID: Crystal Mcdonald, female    DOB: [birth year], 27 y.o.   MRN: [id number]  Chief Complaint  Patient presents with   Follow-up    Referring provider: No ref. provider found  HPI: 27 year, never smoked. Past medical history significant for spontaneous pneumothorax, depression.  Hospital discharge summary  Brief/Interim Summary: Crystal Mcdonald is a 27 y.o. female with medical history significant for depression who presented to the ED for evaluation of shortness of breath.  Imaging revealed pneumothorax, presumed to be spontaneous given no clear trauma or etiology for its origin.  Hospitalist called for admission, pulmonology consulted for chest tube placement.   Patient admitted as above with spontaneous pneumothorax, no clear etiology.  Pulmonology consulted for chest tube placement.  Patient had uneventful hospitalization, weaned from vacuum seal to waterseal ultimately clamped and chest tube removed this morning.  Repeat chest x-ray shows continued inflation of right lung with no further symptoms.  Patient otherwise stable and agreeable for discharge home.  No necessary follow-up at this time with pulmonology however if symptoms recur patient is encouraged to report back to the hospital for further evaluation and may need further procedure if this becomes a recurrent issue.  Discharge Diagnoses:  Principal Problem:   Spontaneous pneumothorax Active Problems:   Depression   08/29/2023- Interim hx  Patient presents today for acute office visit due to shortness of breath. Dx with spontaneous pneumothorax on July 3rd, unclear etiology. Symptoms started 5 days after swimming. No history of trauma, MVC or connective tissue disorders. S/p chest tube placement by PCCM 7/3 with resolution, discontinued on 7/5.   Accompanied by her mother. Shortness of breath has improved over the last month. She experiences shortness of breath symptoms when its hot/ humid or with exercise. She has  an Albuterol rescue inhaler. She was requiring inhaler daily after being discharged but has only required SABA once in the last two weeks. She did notice clinical benefit when using.  Denies fever, rashes, joints, chest pain, chest tightness, wheezing or cough. No childhood history of history.    No Known Allergies   There is no immunization history on file for this patient.  Past Medical History:  Diagnosis Date   Depression     Tobacco History: Social History   Tobacco Use  Smoking Status Never  Smokeless Tobacco Not on file   Counseling given: Not Answered   Outpatient Medications Prior to Visit  Medication Sig Dispense Refill   albuterol (VENTOLIN HFA) 108 (90 Base) MCG/ACT inhaler Inhale 2 puffs into the lungs every 4 (four) hours as needed.     escitalopram (LEXAPRO) 10 MG tablet Take 10 mg by mouth every evening.     Multiple Vitamins-Minerals (MULTIVITAMIN GUMMIES ADULT) CHEW Chew 2 each by mouth every evening.     No facility-administered medications prior to visit.      Review of Systems  Review of Systems  Constitutional: Negative.   HENT: Negative.    Respiratory:  Positive for shortness of breath. Negative for cough, chest tightness and wheezing.   Cardiovascular:  Negative for chest pain.   Physical Exam  BP 112/78 (BP Location: Right Arm, Cuff Size: Normal)   Pulse 82   Ht 5\' 8"  (1.727 m)   Wt 197 lb (89.4 kg)   SpO2 100%   BMI 29.95 kg/m  Physical Exam Constitutional:      Appearance: Normal appearance.  HENT:     Head: Normocephalic and atraumatic.  Cardiovascular:  Rate and Rhythm: Normal rate.  Pulmonary:     Comments: CTA; O2 100% RA Musculoskeletal:        General: Normal range of motion.  Skin:    General: Skin is warm and dry.  Neurological:     General: No focal deficit present.     Mental Status: She is alert and oriented to person, place, and time. Mental status is at baseline.  Psychiatric:        Mood and Affect: Mood  normal.        Behavior: Behavior normal.        Thought Content: Thought content normal.        Judgment: Judgment normal.      Lab Results:  CBC    Component Value Date/Time   WBC 10.3 06/20/2023 0100   RBC 4.27 06/20/2023 0100   HGB 13.2 06/20/2023 0100   HCT 38.5 06/20/2023 0100   PLT 312 06/20/2023 0100   MCV 90.2 06/20/2023 0100   MCH 30.9 06/20/2023 0100   MCHC 34.3 06/20/2023 0100   RDW 12.1 06/20/2023 0100   LYMPHSABS 2.3 11/09/2018 1756   MONOABS 0.6 11/09/2018 1756   EOSABS 0.0 11/09/2018 1756   BASOSABS 0.0 11/09/2018 1756    BMET    Component Value Date/Time   NA 136 06/20/2023 0100   K 4.1 06/20/2023 0100   CL 106 06/20/2023 0100   CO2 20 (L) 06/20/2023 0100   GLUCOSE 125 (H) 06/20/2023 0100   BUN 8 06/20/2023 0100   CREATININE 0.66 06/20/2023 0100   CALCIUM 8.8 (L) 06/20/2023 0100   GFRNONAA >60 06/20/2023 0100   GFRAA >60 11/09/2018 1756    BNP No results found for: "BNP"  ProBNP No results found for: "PROBNP"  Imaging: DG Chest 2 View  Result Date: 08/29/2023 CLINICAL DATA:  Shortness of breath. EXAM: CHEST - 2 VIEW COMPARISON:  July 01, 2023. FINDINGS: The heart size and mediastinal contours are within normal limits. Both lungs are clear. The visualized skeletal structures are unremarkable. IMPRESSION: No active cardiopulmonary disease. Electronically Signed   By: Lupita Raider M.D.   On: 08/29/2023 09:40     Assessment & Plan:   Spontaneous pneumothorax - Dx with spontaneous pneumothorax on July 3rd, unclear etiology. Symptoms started 5 days after swimming. No history of trauma, MVC or connective tissue disorders. S/p chest tube placement by PCCM 7/3 with resolution, discontinued on 7/5.  - Shortness of breath has improved, she still experience dyspnea symptoms with heat/humidity or exercises. Lungs clear on exam. O2 100% RA. Denies chest pain. - Patient had CXR today, results pending  - If re-occurrence or PFT come back abnormal, would  proceed with CT chest to evaluate for blebs or intrinsic disease  - Advised patient present to ED if she develops sudden shortness of breath/wheezing  - FU in 4-6 months with Dr. Wynona Neat   Dyspnea - Improved; Continue to have intermittent dyspnea symptoms. No associated chest tightness, wheezing or cough. Reports clinical benefit from SABA. Recommend getting pulmonary function testing in 1 month.      Glenford Bayley, NP 08/29/2023

## 2023-08-29 NOTE — Patient Instructions (Addendum)
Awaiting chest xray results Ordering pulmonary function testing for 1 month Use Albuterol 2 puffs every 4-6 hours for breakthrough shortness of breath/wheezing   Follow-up: 4-6 months with Dr. Wynona Neat   Pneumothorax A pneumothorax is commonly called a collapsed lung. It is a condition in which air leaks from a lung and builds up between the thin layer of tissue that covers the lungs (visceral pleura)and the interior wall of the chest cavity (parietal pleura). The air gets trapped outside the lung, between the lung and the chest wall (pleural space). The air takes up space and prevents the lung from fully expanding. This condition sometimes occurs suddenly with no apparent cause. The buildup of air may be small or large. A small pneumothorax may go away on its own. A large pneumothorax will require treatment and hospitalization. What are the causes? This condition may be caused by: Trauma and injury to the chest wall. Surgery and other medical procedures. A complication of an underlying lung problem, especially chronic obstructive pulmonary disease (COPD) or emphysema. Sometimes the cause of this condition is not known. What increases the risk? You are more likely to develop this condition if: You have an underlying lung problem. You smoke. You are 37-57 years old, female, tall, and underweight. You have a personal or family history of pneumothorax. You have an eating disorder (anorexia nervosa). This condition can also happen quickly, even in people with no history of lung problems. What are the signs or symptoms? Sometimes a pneumothorax will have no symptoms. When symptoms are present, they may include: Chest pain. Shortness of breath. Increased rate of breathing. Bluish color to the skin, lips, or fingernails (cyanosis). How is this diagnosed? This condition may be diagnosed by: A medical history and physical exam. A chest X-ray, chest CT scan, or ultrasound. How is this  treated? Treatment depends on how severe your condition is. The goal of treatment is to remove the extra air and allow your lung to expand back to its normal size. For a small pneumothorax: No treatment may be needed. Extra oxygen is sometimes used to make it go away more quickly. For a large pneumothorax or one that is causing symptoms, a procedure is done to release the air from around your lungs. To do this, a health care provider may use: A needle with a syringe. This is used to suck air from a pleural space where no additional leakage is taking place. A chest tube. This is used to suck air where there is ongoing leakage into the pleural space. The chest tube may need to remain in place for several days until the air leak has healed. In more severe cases, surgery may be needed to repair the damage that is causing the leak. If you have multiple pneumothorax episodes or have an air leak that will not heal, a procedure called a pleurodesis may be done. A medicine is placed in the pleural space to irritate the tissues around the lung so that the lung will stick to the chest wall, seal any leaks, and stop any buildup of air in that space. If you have an underlying lung problem, severe symptoms, or a large pneumothorax you will usually need to stay in the hospital. Follow these instructions at home: Lifestyle Do not use any products that contain nicotine or tobacco. These products include cigarettes, chewing tobacco, and vaping devices, such as e-cigarettes. If you need help quitting, ask your health care provider. Do not lift anything that is heavier than 10 lb (  4.5 kg), or the limit that you are told, until your health care provider says that it is safe. Avoid activities that take a lot of effort (are strenuous) for as long as told by your health care provider. Return to your normal activities as told by your health care provider. Ask your health care provider what activities are safe for you. Do  not fly in an airplane or scuba dive until your health care provider says it is okay. General instructions Take over-the-counter and prescription medicines only as told by your health care provider. If a cough or pain makes it difficult for you to sleep at night, try sleeping in a semi-upright position in a recliner or by using 2 or 3 pillows. If you had a chest tube and it was removed, ask your health care provider when you can remove the bandage (dressing). While the dressing is in place, do not allow it to get wet. Keep all follow-up visits. This is important. Contact a health care provider if: You cough up thick mucus (sputum) that is yellow or green. You were treated with a chest tube, and you have redness, increasing pain, or discharge at the site where it was placed. Get help right away if: You have increasing chest pain or shortness of breath. You have a cough that will not go away. You begin coughing up blood. You have pain that is getting worse or is not controlled with medicines. The site where your chest tube was located opens up. You feel air coming out of the site where the chest tube was placed. You have a fever or symptoms that last for more than 2-3 days. Your skin, lips, or fingernails turn blue. These symptoms may represent a serious problem that is an emergency. Do not wait to see if the symptoms will go away. Get medical help right away. Call your local emergency services (911 in the [place] Do not drive yourself to the hospital. Summary A pneumothorax, commonly called a collapsed lung, is a condition in which air leaks from a lung and gets trapped between the lung and the chest wall (pleural space). The buildup of air may be small or large. A small pneumothorax may go away on its own. A large pneumothorax will require treatment and hospitalization. Treatment for this condition depends on how severe the pneumothorax is. The goal of treatment is to remove the extra air and  allow the lung to expand back to its normal size. Get help right away if you have increasing chest pain or shortness of breath. This information is not intended to replace advice given to you by your health care provider. Make sure you discuss any questions you have with your health care provider. Document Revised: 05/11/2021 Document Reviewed: 05/11/2021 Elsevier Patient Education  2024 [employer].

## 2023-08-29 NOTE — Assessment & Plan Note (Addendum)
-   Dx with spontaneous pneumothorax on July 3rd, unclear etiology. Symptoms started 5 days after swimming. No history of trauma, MVC or connective tissue disorders. S/p chest tube placement by PCCM 7/3 with resolution, discontinued on 7/5.  - Shortness of breath has improved, she still experience dyspnea symptoms with heat/humidity or exercises. Lungs clear on exam. O2 100% RA. Denies chest pain. - Patient had CXR today, results pending  - If re-occurrence or PFT come back abnormal, would proceed with CT chest to evaluate for blebs or intrinsic disease  - Advised patient present to ED if she develops sudden shortness of breath/wheezing  - FU in 4-6 months with Dr. Wynona Neat

## 2023-08-29 NOTE — Progress Notes (Signed)
Please let patient know CXR showed clear lungs, no reoccurrence of pneumothorax. No active process.

## 2023-12-02 ENCOUNTER — Ambulatory Visit: Payer: BC Managed Care – PPO | Admitting: Pulmonary Disease

## 2023-12-02 ENCOUNTER — Encounter: Payer: Self-pay | Admitting: Pulmonary Disease

## 2023-12-02 VITALS — BP 128/82 | HR 80 | Temp 97.4°F | Ht 68.0 in | Wt 202.0 lb

## 2023-12-02 DIAGNOSIS — R0602 Shortness of breath: Secondary | ICD-10-CM

## 2023-12-02 DIAGNOSIS — J9383 Other pneumothorax: Secondary | ICD-10-CM

## 2023-12-02 LAB — PULMONARY FUNCTION TEST
DL/VA % pred: 115 %
DL/VA: 5.22 ml/min/mmHg/L
DLCO cor % pred: 120 %
DLCO cor: 30.77 ml/min/mmHg
DLCO unc % pred: 120 %
DLCO unc: 30.77 ml/min/mmHg
FEF 25-75 Post: 2.29 L/s
FEF 25-75 Pre: 2.91 L/s
FEF2575-%Change-Post: -21 %
FEF2575-%Pred-Post: 60 %
FEF2575-%Pred-Pre: 76 %
FEV1-%Change-Post: -12 %
FEV1-%Pred-Post: 87 %
FEV1-%Pred-Pre: 99 %
FEV1-Post: 3.15 L
FEV1-Pre: 3.62 L
FEV1FVC-%Change-Post: -10 %
FEV1FVC-%Pred-Pre: 85 %
FEV6-%Change-Post: -2 %
FEV6-%Pred-Post: 113 %
FEV6-%Pred-Pre: 116 %
FEV6-Post: 4.85 L
FEV6-Pre: 4.97 L
FEV6FVC-%Pred-Post: 100 %
FEV6FVC-%Pred-Pre: 100 %
FVC-%Change-Post: -2 %
FVC-%Pred-Post: 113 %
FVC-%Pred-Pre: 115 %
FVC-Post: 4.85 L
FVC-Pre: 4.97 L
Post FEV1/FVC ratio: 65 %
Post FEV6/FVC ratio: 100 %
Pre FEV1/FVC ratio: 73 %
Pre FEV6/FVC Ratio: 100 %
RV % pred: 112 %
RV: 1.72 L
TLC % pred: 110 %
TLC: 6.27 L

## 2023-12-02 NOTE — Patient Instructions (Signed)
Full PFT performed today. °

## 2023-12-02 NOTE — Progress Notes (Signed)
Full PFT performed today. °

## 2023-12-02 NOTE — Patient Instructions (Signed)
Tentative appointment in a year from now  If you are feeling great at that time and you continue to exercise regularly not having a whole lot of symptoms the appointment can be canceled and we will just see you as needed  Your breathing study is completely normal  Call with significant concerns

## 2023-12-02 NOTE — Progress Notes (Signed)
Crystal Mcdonald    [id number]    30-Jul-1996  Primary Care Physician:Hamrick, Durward Fortes, MD  Referring Physician: No referring provider defined for this encounter.  Chief complaint:   Follow-up for shortness of breath  HPI:  Has been feeling relatively well  Hardly needs use of albuterol Denies any chest pains or chest discomfort  Has been getting more active and going to a gym  Denies any chest pain or chest discomfort  Does not recollect using albuterol in the last month  She does have a family history of asthma  Past history of spontaneous pneumothorax  Never smoker No pertinent occupational history  Outpatient Encounter Medications as of 12/02/2023  Medication Sig   albuterol (VENTOLIN HFA) 108 (90 Base) MCG/ACT inhaler Inhale 2 puffs into the lungs every 4 (four) hours as needed.   escitalopram (LEXAPRO) 10 MG tablet Take 10 mg by mouth every evening.   Multiple Vitamins-Minerals (MULTIVITAMIN GUMMIES ADULT) CHEW Chew 2 each by mouth every evening.   No facility-administered encounter medications on file as of 12/02/2023.    Allergies as of 12/02/2023   (No Known Allergies)    Past Medical History:  Diagnosis Date   Depression     No past surgical history on file.  No family history on file.  Social History   Socioeconomic History   Marital status: Single    Spouse name: Not on file   Number of children: Not on file   Years of education: Not on file   Highest education level: Not on file  Occupational History   Not on file  Tobacco Use   Smoking status: Never   Smokeless tobacco: Not on file  Substance and Sexual Activity   Alcohol use: No   Drug use: No   Sexual activity: Not on file  Other Topics Concern   Not on file  Social History Narrative   Not on file   Social Drivers of Health   Financial Resource Strain: Not on file  Food Insecurity: No Food Insecurity (06/19/2023)   Hunger Vital Sign    Worried About Running Out  of Food in the Last Year: Never true    Ran Out of Food in the Last Year: Never true  Transportation Needs: No Transportation Needs (06/19/2023)   PRAPARE - Administrator, Civil Service (Medical): No    Lack of Transportation (Non-Medical): No  Physical Activity: Not on file  Stress: Not on file  Social Connections: Not on file  Intimate Partner Violence: Not At Risk (06/19/2023)   Humiliation, Afraid, Rape, and Kick questionnaire    Fear of Current or Ex-Partner: No    Emotionally Abused: No    Physically Abused: No    Sexually Abused: No    Review of Systems  Constitutional:  Negative for fatigue.  Respiratory:  Positive for shortness of breath. Negative for cough.     Vitals:   12/02/23 1412 12/02/23 1413  BP: 128/82   Pulse: 80   Temp:  (!) 97.4 F (36.3 C)  SpO2: 97%      Physical Exam Constitutional:      Appearance: Normal appearance.  HENT:     Head: Normocephalic.     Nose: Nose normal.     Mouth/Throat:     Mouth: Mucous membranes are moist.  Neck:     Vascular: No carotid bruit.  Cardiovascular:     Rate and Rhythm: Normal rate and regular  rhythm.     Heart sounds: No murmur heard.    No friction rub.  Pulmonary:     Effort: No respiratory distress.     Breath sounds: No stridor. No wheezing or rhonchi.  Lymphadenopathy:     Cervical: No cervical adenopathy.  Neurological:     Mental Status: She is alert.  Psychiatric:        Mood and Affect: Mood normal.     Data Reviewed: Pulmonary function test was reviewed showing no obstruction, no significant bronchodilator response, no restriction, normal diffusing capacity  Assessment:  Shortness of breath  Past history of spontaneous pneumothorax  Has been doing relatively well with hardly needing albuterol  Plan/Recommendations: Patient reassured especially with stable symptoms  Only needs to use albuterol as needed  Tentative follow-up a year from now May cancel appointment if  feeling well  Encouraged to continue graded activities as tolerated    Virl Diamond MD [facility] 12/02/2023, 2:41 PM  CC: No ref. provider found

## 2024-11-02 ENCOUNTER — Other Ambulatory Visit: Payer: Self-pay

## 2024-11-02 ENCOUNTER — Emergency Department (HOSPITAL_COMMUNITY)

## 2024-11-02 ENCOUNTER — Inpatient Hospital Stay (HOSPITAL_COMMUNITY)
Admission: EM | Admit: 2024-11-02 | Discharge: 2024-11-08 | DRG: 165 | Disposition: A | Discharge status: Home / Self Care | Attending: Thoracic Surgery (Cardiothoracic Vascular Surgery) | Admitting: Thoracic Surgery (Cardiothoracic Vascular Surgery)

## 2024-11-02 ENCOUNTER — Ambulatory Visit
Admission: RE | Admit: 2024-11-02 | Discharge: 2024-11-02 | Disposition: A | Source: Ambulatory Visit | Attending: Family Medicine | Admitting: Family Medicine

## 2024-11-02 ENCOUNTER — Other Ambulatory Visit: Payer: Self-pay | Admitting: Family Medicine

## 2024-11-02 DIAGNOSIS — Z6832 Body mass index (BMI) 32.0-32.9, adult: Secondary | ICD-10-CM

## 2024-11-02 DIAGNOSIS — R0602 Shortness of breath: Secondary | ICD-10-CM | POA: Diagnosis not present

## 2024-11-02 DIAGNOSIS — R0789 Other chest pain: Secondary | ICD-10-CM

## 2024-11-02 DIAGNOSIS — E669 Obesity, unspecified: Secondary | ICD-10-CM | POA: Diagnosis present

## 2024-11-02 DIAGNOSIS — D72829 Elevated white blood cell count, unspecified: Secondary | ICD-10-CM | POA: Diagnosis present

## 2024-11-02 DIAGNOSIS — Z79899 Other long term (current) drug therapy: Secondary | ICD-10-CM

## 2024-11-02 DIAGNOSIS — E66811 Obesity, class 1: Secondary | ICD-10-CM | POA: Diagnosis present

## 2024-11-02 DIAGNOSIS — Z09 Encounter for follow-up examination after completed treatment for conditions other than malignant neoplasm: Secondary | ICD-10-CM

## 2024-11-02 DIAGNOSIS — E876 Hypokalemia: Secondary | ICD-10-CM | POA: Diagnosis present

## 2024-11-02 DIAGNOSIS — J9383 Other pneumothorax: Secondary | ICD-10-CM | POA: Diagnosis not present

## 2024-11-02 DIAGNOSIS — J939 Pneumothorax, unspecified: Principal | ICD-10-CM

## 2024-11-02 DIAGNOSIS — F32A Depression, unspecified: Secondary | ICD-10-CM | POA: Diagnosis present

## 2024-11-02 LAB — CBC WITH DIFFERENTIAL/PLATELET
Abs Immature Granulocytes: 0.04 K/uL (ref 0.00–0.07)
Basophils Absolute: 0 K/uL (ref 0.0–0.1)
Basophils Relative: 0 %
Eosinophils Absolute: 0.1 K/uL (ref 0.0–0.5)
Eosinophils Relative: 1 %
HCT: 40.2 % (ref 36.0–46.0)
Hemoglobin: 13.5 g/dL (ref 12.0–15.0)
Immature Granulocytes: 0 %
Lymphocytes Relative: 20 %
Lymphs Abs: 2 K/uL (ref 0.7–4.0)
MCH: 30.1 pg (ref 26.0–34.0)
MCHC: 33.6 g/dL (ref 30.0–36.0)
MCV: 89.5 fL (ref 80.0–100.0)
Monocytes Absolute: 0.6 K/uL (ref 0.1–1.0)
Monocytes Relative: 6 %
Neutro Abs: 7.5 K/uL (ref 1.7–7.7)
Neutrophils Relative %: 73 %
Platelets: 324 K/uL (ref 150–400)
RBC: 4.49 MIL/uL (ref 3.87–5.11)
RDW: 12 % (ref 11.5–15.5)
WBC: 10.3 K/uL (ref 4.0–10.5)
nRBC: 0 % (ref 0.0–0.2)

## 2024-11-02 LAB — BASIC METABOLIC PANEL WITH GFR
Anion gap: 10 (ref 5–15)
BUN: 11 mg/dL (ref 6–20)
CO2: 20 mmol/L — ABNORMAL LOW (ref 22–32)
Calcium: 9.3 mg/dL (ref 8.9–10.3)
Chloride: 108 mmol/L (ref 98–111)
Creatinine, Ser: 0.8 mg/dL (ref 0.44–1.00)
GFR, Estimated: >60 mL/min (ref >60)
Glucose, Bld: 110 mg/dL — ABNORMAL HIGH (ref 70–99)
Potassium: 3.3 mmol/L — ABNORMAL LOW (ref 3.5–5.1)
Sodium: 138 mmol/L (ref 135–145)

## 2024-11-02 LAB — HCG, SERUM, QUALITATIVE: Preg, Serum: NEGATIVE

## 2024-11-02 MED ORDER — FENTANYL CITRATE (PF) 50 MCG/ML IJ SOSY
50.0000 ug | PREFILLED_SYRINGE | Freq: Once | INTRAMUSCULAR | Status: AC
  Administered 2024-11-02: 50 ug via INTRAVENOUS
  Filled 2024-11-02 (×2): qty 1

## 2024-11-02 MED ORDER — LORAZEPAM 1 MG PO TABS
1.0000 mg | ORAL_TABLET | Freq: Once | ORAL | Status: AC
  Administered 2024-11-02: 1 mg via ORAL
  Filled 2024-11-02: qty 1

## 2024-11-02 MED ORDER — KETAMINE HCL 50 MG/5ML IJ SOSY
PREFILLED_SYRINGE | INTRAMUSCULAR | Status: AC
  Administered 2024-11-03: 70 mg via INTRAVENOUS
  Filled 2024-11-02: qty 5

## 2024-11-02 MED ORDER — LIDOCAINE HCL (PF) 1 % IJ SOLN
10.0000 mL | Freq: Once | INTRAMUSCULAR | Status: AC
  Administered 2024-11-02: 10 mL
  Filled 2024-11-02: qty 10

## 2024-11-02 MED ORDER — KETAMINE HCL 10 MG/ML IJ SOLN
INTRAMUSCULAR | Status: AC | PRN
  Administered 2024-11-02: 50 mg via INTRAVENOUS
  Administered 2024-11-02 (×2): 10 mg via INTRAVENOUS

## 2024-11-02 MED ORDER — KETAMINE HCL 50 MG/5ML IJ SOSY
0.3000 mg/kg | PREFILLED_SYRINGE | Freq: Once | INTRAMUSCULAR | Status: AC
  Filled 2024-11-02: qty 5

## 2024-11-02 NOTE — ED Provider Notes (Incomplete)
 [place] EMERGENCY DEPARTMENT AT [facility] Provider Note   CSN: [id number] Arrival date & time: 11/02/24  1826     Patient presents with: No chief complaint on file.   Crystal Mcdonald is a 28 y.o. female.  {Add pertinent medical, surgical, social history, OB history to HPI:32947}  depression who presented to the ED for evaluation of shortness of breath. Evaluated by PCP today, received CXR, found to have spontaneous pneumothorax.  Patient endorses she developed right sided back pain 4 days ago, then 2 days ago developed shortness of breath.  She denies associated chest pain.  She went to her PCP today with shortness of breath, received a chest x-ray with her known history and evaluated to have a small pneumothorax.  Sent to the ED as will require chest tube placement.  Denies any fever or chills.  Denies nausea or vomiting.  Of note on chart review, approximately 1 year ago patient was presented to the ED with spontaneous pneumothorax at that time on the right side. Chest tube was placed, no complications with admission.         Prior to Admission medications   Medication Sig Start Date End Date Taking? Authorizing Provider  albuterol (VENTOLIN HFA) 108 (90 Base) MCG/ACT inhaler Inhale 2 puffs into the lungs every 4 (four) hours as needed. 08/16/23   [provider]  escitalopram  (LEXAPRO ) 10 MG tablet Take 10 mg by mouth every evening. 04/16/23   [provider]  Multiple Vitamins-Minerals (MULTIVITAMIN GUMMIES ADULT) CHEW Chew 2 each by mouth every evening.    [provider]    Allergies: Patient has no known allergies.    Review of Systems  Updated Vital Signs BP (!) 143/82 (BP Location: Right Arm)   Pulse (!) 114   Temp 97.7 F (36.5 C)   Resp 20   Ht 5' 8 (1.727 m)   Wt 96.6 kg   SpO2 99%   BMI 32.39 kg/m   Physical Exam Vitals and nursing note reviewed.  Constitutional:      General: She is not in acute distress.     Appearance: Normal appearance. She is not ill-appearing.  HENT:     Head: Normocephalic and atraumatic.     Mouth/Throat:     Mouth: Mucous membranes are moist.     Pharynx: Oropharynx is clear.  Eyes:     Pupils: Pupils are equal, round, and reactive to light.  Cardiovascular:     Rate and Rhythm: Normal rate and regular rhythm.     Pulses: Normal pulses.     Heart sounds: Normal heart sounds. No murmur heard.    No gallop.  Pulmonary:     Effort: Pulmonary effort is normal.     Breath sounds: No wheezing, rhonchi or rales.     Comments: Absent breath sounds on the R side  Abdominal:     General: Abdomen is flat. There is no distension.     Palpations: Abdomen is soft.     Tenderness: There is no abdominal tenderness. There is no right CVA tenderness, left CVA tenderness, guarding or rebound.  Musculoskeletal:     Cervical back: Neck supple. No rigidity.  Skin:    General: Skin is warm.     Capillary Refill: Capillary refill takes less than 2 seconds.  Neurological:     General: No focal deficit present.     Mental Status: She is alert and oriented to person, place, and time. Mental status is at  baseline.     (all labs ordered are listed, but only abnormal results are displayed) Labs Reviewed  BASIC METABOLIC PANEL WITH GFR - Abnormal; Notable for the following components:      Result Value   Potassium 3.3 (*)    CO2 20 (*)    Glucose, Bld 110 (*)    All other components within normal limits  HCG, SERUM, QUALITATIVE  CBC WITH DIFFERENTIAL/PLATELET    EKG: None  Radiology: DG Chest 2 View Result Date: 11/02/2024 EXAM: 2 VIEW(S) XRAY OF THE CHEST 11/02/2024 04:39:20 PM COMPARISON: Chest x-ray on 08/29/2023. CLINICAL HISTORY: chest pain chest pain FINDINGS: Patient slightly rotated. LUNGS AND PLEURA: Moderate-sized right apical pneumothorax. No focal pulmonary opacity. No pleural effusion. HEART AND MEDIASTINUM: Unchanged cardiomediastinal silhouette. Right heart  border not visualized likely due to patient rotation. No tracheal deviation. BONES AND SOFT TISSUES: No acute osseous abnormality. IMPRESSION: 1. Moderate-sized right apical pneumothorax. No definite tension component. PRA call report. Electronically signed by: Morgane Naveau MD 11/02/2024 05:30 PM EST RP Workstation: [id number]    {Document cardiac monitor, telemetry assessment procedure when appropriate:32947} Procedures   Medications Ordered in the ED - No data to display  Clinical Course as of 11/02/24 2125  Mon Nov 02, 2024  2105 I have low suspicion for ACS at this time, will hold on troponin.  Low suspicion for PE, patient heart rate significantly improved while resting comfortably in bed. [BS]    Clinical Course User Index [BS] Arlee Katz, MD   {Click here for ABCD2, HEART and other calculators REFRESH Note before signing:1}                              Medical Decision Making Risk Prescription drug management.   ***  {Document critical care time when appropriate  Document review of labs and clinical decision tools ie CHADS2VASC2, etc  Document your independent review of radiology images and any outside records  Document your discussion with family members, caretakers and with consultants  Document social determinants of health affecting pt's care  Document your decision making why or why not admission, treatments were needed:32947:::1}   Final diagnoses:  None    ED Discharge Orders     None

## 2024-11-02 NOTE — ED Triage Notes (Addendum)
 Pt states that she has had difficulty breathing since Thursday and cp since Saturday. Pt was seen by PCP today and had a cxr done and was told that she has a collapsed lung. No injury. Hx of same right sided spontaneous pneumo  XR done and in chart

## 2024-11-02 NOTE — ED Triage Notes (Signed)
 Patient in ED with complaints of SHOB since Saturday. Denies any recent illness. She is also reports chest pain when she breathes, lays down, or walks too much. SHe reports that the pain is in her right chest and in the right side of her back and it feels like pressure and tightness.  Reports no history

## 2024-11-02 NOTE — ED Provider Triage Note (Signed)
 Emergency Medicine Provider Triage Evaluation Note  Crystal Mcdonald , a 28 y.o. female  was evaluated in triage.  Pt complains of right posterior shoulder pain, back pain, imaging done at outside facility showed right sided pneumothorax.  Previous history of same.  Endorses some mild dyspnea, however at baseline has minimal pain, no dyspnea at rest.  Review of Systems  Positive: As above Negative:   Physical Exam  BP (!) 143/82 (BP Location: Right Arm)   Pulse (!) 114   Temp 97.7 F (36.5 C)   Resp 20   Ht 5' 8 (1.727 m)   Wt 96.6 kg   SpO2 99%   BMI 32.39 kg/m  Gen:   Awake, no distress   Resp:  Normal effort  MSK:   Moves extremities without difficulty  Other:  Decreased breath sounds on the right base.  Medical Decision Making  Medically screening exam initiated at 7:42 PM.  Appropriate orders placed.  Crystal Mcdonald was informed that the remainder of the evaluation will be completed by another provider, this initial triage assessment does not replace that evaluation, and the importance of remaining in the ED until their evaluation is complete.  Initial imaging and orders placed   Myriam Dorn BROCK[place], [place] 11/02/24 1944

## 2024-11-03 ENCOUNTER — Emergency Department (HOSPITAL_COMMUNITY)

## 2024-11-03 DIAGNOSIS — J9311 Primary spontaneous pneumothorax: Secondary | ICD-10-CM | POA: Diagnosis not present

## 2024-11-03 DIAGNOSIS — Z6832 Body mass index (BMI) 32.0-32.9, adult: Secondary | ICD-10-CM | POA: Diagnosis not present

## 2024-11-03 DIAGNOSIS — E66811 Obesity, class 1: Secondary | ICD-10-CM | POA: Diagnosis present

## 2024-11-03 DIAGNOSIS — J9383 Other pneumothorax: Secondary | ICD-10-CM | POA: Diagnosis present

## 2024-11-03 DIAGNOSIS — J439 Emphysema, unspecified: Secondary | ICD-10-CM | POA: Diagnosis not present

## 2024-11-03 DIAGNOSIS — F32A Depression, unspecified: Secondary | ICD-10-CM | POA: Diagnosis present

## 2024-11-03 DIAGNOSIS — Z79899 Other long term (current) drug therapy: Secondary | ICD-10-CM | POA: Diagnosis not present

## 2024-11-03 DIAGNOSIS — D72829 Elevated white blood cell count, unspecified: Secondary | ICD-10-CM | POA: Diagnosis present

## 2024-11-03 DIAGNOSIS — R0602 Shortness of breath: Secondary | ICD-10-CM | POA: Diagnosis present

## 2024-11-03 DIAGNOSIS — E876 Hypokalemia: Secondary | ICD-10-CM | POA: Diagnosis present

## 2024-11-03 DIAGNOSIS — E669 Obesity, unspecified: Secondary | ICD-10-CM | POA: Diagnosis present

## 2024-11-03 LAB — CREATININE, SERUM
Creatinine, Ser: 0.76 mg/dL (ref 0.44–1.00)
GFR, Estimated: >60 mL/min (ref >60)

## 2024-11-03 LAB — CBC
HCT: 39.6 % (ref 36.0–46.0)
Hemoglobin: 13.5 g/dL (ref 12.0–15.0)
MCH: 30.8 pg (ref 26.0–34.0)
MCHC: 34.1 g/dL (ref 30.0–36.0)
MCV: 90.2 fL (ref 80.0–100.0)
Platelets: 304 K/uL (ref 150–400)
RBC: 4.39 MIL/uL (ref 3.87–5.11)
RDW: 12 % (ref 11.5–15.5)
WBC: 11.4 K/uL — ABNORMAL HIGH (ref 4.0–10.5)
nRBC: 0 % (ref 0.0–0.2)

## 2024-11-03 MED ORDER — POLYETHYLENE GLYCOL 3350 17 G PO PACK: 17.0000 g | PACK | Freq: Every day | ORAL | Status: DC | PRN

## 2024-11-03 MED ORDER — ENOXAPARIN SODIUM 40 MG/0.4ML IJ SOSY
40.0000 mg | PREFILLED_SYRINGE | INTRAMUSCULAR | Status: DC
  Filled 2024-11-03: qty 0.4

## 2024-11-03 MED ORDER — POTASSIUM CHLORIDE CRYS ER 20 MEQ PO TBCR
20.0000 meq | EXTENDED_RELEASE_TABLET | Freq: Two times a day (BID) | ORAL | Status: AC
  Administered 2024-11-03 (×2): 20 meq via ORAL
  Filled 2024-11-03 (×2): qty 1

## 2024-11-03 MED ORDER — HYDROMORPHONE HCL 1 MG/ML IJ SOLN
0.5000 mg | INTRAMUSCULAR | Status: DC | PRN
  Administered 2024-11-03 – 2024-11-05 (×9): 0.5 mg via INTRAVENOUS
  Filled 2024-11-03: qty 1
  Filled 2024-11-03 (×2): qty 0.5
  Filled 2024-11-03: qty 1
  Filled 2024-11-03: qty 0.5
  Filled 2024-11-03: qty 1
  Filled 2024-11-03 (×2): qty 0.5
  Filled 2024-11-03: qty 1

## 2024-11-03 MED ORDER — HYDROMORPHONE HCL 1 MG/ML IJ SOLN
0.5000 mg | INTRAMUSCULAR | Status: DC | PRN
Start: 2024-11-03 — End: 2024-11-03
  Administered 2024-11-03 (×2): 0.5 mg via INTRAVENOUS
  Filled 2024-11-03 (×2): qty 1

## 2024-11-03 MED ORDER — OXYCODONE HCL 5 MG PO TABS: 5.0000 mg | ORAL_TABLET | Freq: Four times a day (QID) | ORAL | Status: DC | PRN

## 2024-11-03 MED ORDER — HYDROMORPHONE HCL 1 MG/ML IJ SOLN
1.0000 mg | Freq: Once | INTRAMUSCULAR | Status: AC
  Administered 2024-11-03: 1 mg via INTRAVENOUS
  Filled 2024-11-03: qty 1

## 2024-11-03 MED ORDER — MELATONIN 5 MG PO TABS
5.0000 mg | ORAL_TABLET | Freq: Every evening | ORAL | Status: DC | PRN
  Administered 2024-11-04: 5 mg via ORAL
  Filled 2024-11-03: qty 1

## 2024-11-03 MED ORDER — OXYCODONE HCL 5 MG PO TABS
10.0000 mg | ORAL_TABLET | ORAL | Status: DC | PRN
Start: 2024-11-03 — End: 2024-11-04

## 2024-11-03 MED ORDER — NAPROXEN 250 MG PO TABS
500.0000 mg | ORAL_TABLET | Freq: Two times a day (BID) | ORAL | Status: DC
  Administered 2024-11-03 – 2024-11-05 (×5): 500 mg via ORAL
  Filled 2024-11-03 (×9): qty 2

## 2024-11-03 MED ORDER — ACETAMINOPHEN 500 MG PO TABS
500.0000 mg | ORAL_TABLET | Freq: Four times a day (QID) | ORAL | Status: DC | PRN
  Filled 2024-11-03: qty 1

## 2024-11-03 MED ORDER — PROCHLORPERAZINE EDISYLATE 10 MG/2ML IJ SOLN: 5.0000 mg | Freq: Four times a day (QID) | INTRAMUSCULAR | Status: DC | PRN

## 2024-11-03 MED ORDER — ONDANSETRON HCL 4 MG/2ML IJ SOLN
4.0000 mg | Freq: Once | INTRAMUSCULAR | Status: AC
  Administered 2024-11-03: 4 mg via INTRAVENOUS
  Filled 2024-11-03: qty 2

## 2024-11-03 NOTE — Consult Note (Signed)
 NAME:  Crystal Mcdonald, MRN:  [id number], DOB:  Apr 24, 1996, LOS: 0 ADMISSION DATE:  11/02/2024, CONSULTATION DATE: 11/03/2024 REFERRING MD: Dr. Cindy, CHIEF COMPLAINT: Recurrent pneumothorax  History of Present Illness:  Patient came in with a few days history of right-sided chest pain  Past history of pneumothorax about a year ago that was managed with a chest tube She was not performing any stressful exercises prior to onset  Pneumothorax 06/19/2023 following swimming, had a chest tube for 2 days  Followed up in the pulmonary office No underlying disease noted, not a smoker  Pertinent  Medical History   Past Medical History:  Diagnosis Date   Depression     Significant Hospital Events: Including procedures, antibiotic start and stop dates in addition to other pertinent events   CT chest 11/02/2024 with moderate pneumothorax  Interim History / Subjective:  She feels fine, some discomfort at chest tube site  Objective    Blood pressure 108/78, pulse 94, temperature 97.9 F (36.6 C), temperature source Oral, resp. rate 16, height 5' 8 (1.727 m), weight 96.6 kg, SpO2 100%.       No intake or output data in the 24 hours ending 11/03/24 0954 Filed Weights   11/02/24 1841  Weight: 96.6 kg    Examination: General: Young lady does not appear to be in distress HENT: Moist oral mucosa Lungs: Fair air entry bilaterally Cardiovascular: S1-S2 appreciated Abdomen: Soft, bowel sounds appreciated Extremities: No clubbing, no edema Neuro: Awake alert oriented x 3 GU: Nonfocal exam  I reviewed last 24 h vitals and pain scores, last 48 h intake and output, last 24 h labs and trends, and last 24 h imaging results.  Pulmonary function test 12/02/2023-minimal obstructive disease with no significant bronchodilator response  Resolved problem list   Assessment and Plan   Recurrent pneumothorax - Non-smoker, no underlying lung disease  Risk of recurrence is very high with  conservative management Outcome is best with VATS pleurodesis  Will recommend to have cardiothoracic surgery evaluate patient [place] pleurodesis can be considered if patient felt not to be a VATS candidate  Continue chest tube to suction Labs   CBC: Recent Labs  Lab 11/02/24 1948 11/03/24 0507  WBC 10.3 11.4*  NEUTROABS 7.5  --   HGB 13.5 13.5  HCT 40.2 39.6  MCV 89.5 90.2  PLT 324 304    Basic Metabolic Panel: Recent Labs  Lab 11/02/24 1948 11/03/24 0507  NA 138  --   K 3.3*  --   CL 108  --   CO2 20*  --   GLUCOSE 110*  --   BUN 11  --   CREATININE 0.80 0.76  CALCIUM 9.3  --    GFR: Estimated Creatinine Clearance: 127.3 mL/min (by C-G formula based on SCr of 0.76 mg/dL). Recent Labs  Lab 11/02/24 1948 11/03/24 0507  WBC 10.3 11.4*    Liver Function Tests: No results for input(s): AST, ALT, ALKPHOS, BILITOT, PROT, ALBUMIN in the last 168 hours. No results for input(s): LIPASE, AMYLASE in the last 168 hours. No results for input(s): AMMONIA in the last 168 hours.  ABG No results found for: PHART, PCO2ART, PO2ART, HCO3, TCO2, ACIDBASEDEF, O2SAT   Coagulation Profile: No results for input(s): INR, PROTIME in the last 168 hours.  Cardiac Enzymes: No results for input(s): CKTOTAL, CKMB, CKMBINDEX, TROPONINI in the last 168 hours.  HbA1C: No results found for: HGBA1C  CBG: No results for input(s): GLUCAP in the last 168 hours.  Review of Systems:   No shortness of breath with normal activity  Past Medical History:  She,  has a past medical history of Depression.   Surgical History:  No past surgical history on file.   Social History:   reports that she has never smoked. She does not have any smokeless tobacco history on file. She reports that she does not drink alcohol and does not use drugs.   Family History:  Her family history is not on file.   Allergies No Known Allergies   Crystal Epley,  MD [facility] Pager: See Tracey

## 2024-11-03 NOTE — ED Notes (Signed)
Unsuccessful attempt

## 2024-11-03 NOTE — H&P (Signed)
 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, broadcasting/film/video teaches first grade ]history of depression no history of connective tissue disorder Previous pneumothorax 06/19/2023 started after swimming-she had chest tube placed that was removed about 2 days later She followed in the office with pulmonary 08/29/2023 she still experience some dyspnea-PFTs were performed and on office visit repeat 12/02/2023 no obstruction no significant bronchodilator response no restriction normal diffusing capacity  Review of Systems:  As mentioned above in HPI are pertinent +'s Pertinent negatives as per below   ED Course: Chest tube was placed by emergency room right lateral decubitus size 16 French to suction successfully  Pulmonology consulted   Past Medical History:  Diagnosis Date   Depression    No past surgical history on file.  reports that she has never smoked. She does not have any smokeless tobacco history on file. She reports that she does not drink alcohol and does not use drugs.  Mobility: Independent  No Known Allergies No family history on file. Prior to Admission medications   Medication Sig Start Date End Date Taking? Authorizing Provider  albuterol (VENTOLIN HFA) 108 (90 Base) MCG/ACT inhaler Inhale 2 puffs into the lungs every 4 (four) hours as needed. 08/16/23   [provider]  escitalopram  (LEXAPRO ) 10 MG tablet Take 10 mg by mouth every evening. 04/16/23   [provider]  Multiple Vitamins-Minerals (MULTIVITAMIN GUMMIES ADULT) CHEW Chew 2 each by mouth every evening.    [provider]    Physical Exam:  Vitals:   11/03/24 0440 11/03/24 0600  BP: 93/62 113/69  Pulse: 91 86  Resp: 13 (!) 21  Temp:    SpO2: 98% 99%    Awake coherent and some amount of pain from the tube itself EOMI  NCAT no focal deficit no icterus no pallor breath sounds are bilateral equally Neck soft supple S1-S2 no murmur Abdomen soft obese nontender no rebound no guarding No lower extremity edema   I have personally reviewed following labs and imaging studies  Labs:  WBC 11.4Hemoglobin 13.5 platelet 304 Sodium 138 potassium 3.3 BUN/creatinine 11/0.8 Pregnancy  Imaging studies:  CXR 11/02/2024 moderate size right apical pneumothorax no definite tension component CT chest moderate volume right anterior pneumothorax without tension component  Medical tests:  EKG independently reviewed: Personally reviewed sinus tachycardia with rate related changes borderline criteria for LVH but could be rate related  Test discussed with performing physician: Yes discussed with pulmonology  Decision to obtain old records:  Yes  Review and summation of old records:  Yes  Principal Problem:   Spontaneous pneumothorax   Assessment/Plan Spontaneous pneumothorax  This is recurrent dialysis occurred last year and she had PFTs and negative imagings subsequently and was followed by pulmonologist for this in December Chest tube has been placed We will defer to their collective experience the next step in terms of management-May need discussion with thoracic surgery for VATS with pleurodesis and dry gauze ablation or stapling-although I do not see any specific blebs on the CT scan We will control her pain aggressively with Naprosyn 500 twice daily scheduled, I have increased her Oxy IR to 10 mg every 4 as needed and she can have Dilaudid 0.5 every 2 as needed for severe pain as well I will let her have a clear liquid diet which can  be graduated to full diet according to discussion with pulmonology-I am not sure if she is going to be able to have any intervention by CVTS later today  Depression Given Lexapro  and resume the same  Mild hypokalemia Replaced with K-Dur 20 twice daily in case procedure is  needed  Reactive leukocytosis No fever Repeat labs in the morning   Severity of Illness: The appropriate patient status for this patient is INPATIENT. Inpatient status is judged to be reasonable and necessary in order to provide the required intensity of service to ensure the patient's safety. The patient's presenting symptoms, physical exam findings, and initial radiographic and laboratory data in the context of their chronic comorbidities is felt to place them at high risk for further clinical deterioration. Furthermore, it is not anticipated that the patient will be medically stable for discharge from the hospital within 2 midnights of admission.   * I certify that at the point of admission it is my clinical judgment that the patient will require inpatient hospital care spanning beyond 2 midnights from the point of admission due to high intensity of service, high risk for further deterioration and high frequency of surveillance required.*   Family Communication: Discussed with mother  DVT ppx: SCD Consults called & Whom: Pulmonology  Time spent: 50 minutes  Royal, MD dufm my NP partners at night for Care related issues] [facility] --[employer] [place] , [email or web address]; password [facility]  11/03/2024, 7:30 AM

## 2024-11-03 NOTE — Consult Note (Cosign Needed)
 [address]       [place] [address]             [phone]        Crystal Mcdonald [facility] Medical Record #[id number] Date of Birth: 1996/11/16  Referring:Chiu Primary Care: Stephanie [name], MD Primary Cardiologist:None  Chief Complaint:  Pneumothorax  History of Present Illness:      Crystal Mcdonald is a 28 yo female with known history of Depression.  She presented to her PCP on 11/02/2024 with complaints complaints of shortness of breath.  This had been occurring since 11/15 and is associated with chest pain with breathing, when she lays down, or walks too much.  She also complains of pain in her right chest and right side of her back.  She denied being sick recently she did admit to previous right sided pneumothorax about 1 year prior.  Chest xray obtained showed a small pneumothorax.  She was subsequently referred to the ED for chest tube placement.  This was done with initial post placement chest xray showing the tube to be kinked,  the tube was repositioned and admitted to the hospital for further care.  She is a lifelong non-smoker.  Her previous pneumothorax was managed with a chest tube for 2 days.  The patient currently has pain at her chest tube site.  Otherwise her symptoms have resolved.  She states both of her pneumothoraces have just occurred.   When questioned about menses, she states she has implanon and does not have cycles.    Past Medical History:  Diagnosis Date   Depression     No past surgical history on file.  Social History   Tobacco Use  Smoking Status Never  Smokeless Tobacco Not on file    Social History   Substance and Sexual Activity  Alcohol Use No   No Known Allergies  Current Facility-Administered Medications  Medication Dose Route Frequency Provider Last Rate Last Admin   acetaminophen  (TYLENOL ) tablet 500 mg  500 mg Oral Q6H PRN Shona, Carole N, DO       enoxaparin (LOVENOX) injection 40 mg  40 mg  Subcutaneous Q24H Hall, Carole N, DO       HYDROmorphone (DILAUDID) injection 0.5 mg  0.5 mg Intravenous Q2H PRN Samtani, Jai-Gurmukh, MD   0.5 mg at 11/03/24 1408   melatonin tablet 5 mg  5 mg Oral QHS PRN Shona Laurence N, DO       naproxen (NAPROSYN) tablet 500 mg  500 mg Oral BID WC Samtani, Jai-Gurmukh, MD   500 mg at 11/03/24 9177   oxyCODONE (Oxy IR/ROXICODONE) immediate release tablet 10 mg  10 mg Oral Q4H PRN Samtani, Jai-Gurmukh, MD       polyethylene glycol (MIRALAX / GLYCOLAX) packet 17 g  17 g Oral Daily PRN Shona, Carole N, DO       potassium chloride  SA (KLOR-CON  M) CR tablet 20 mEq  20 mEq Oral BID Shona Laurence N, DO   20 mEq at 11/03/24 9476   prochlorperazine (COMPAZINE) injection 5 mg  5 mg Intravenous Q6H PRN Shona Laurence LOISE, DO       Current Outpatient Medications  Medication Sig Dispense Refill   Acetaminophen  (TYLENOL  DISSOLVE PACKS) 500 MG PACK Take 1,000 mg by mouth 2 (two) times daily as needed (fever, headache, pain).     escitalopram  (LEXAPRO ) 20 MG tablet Take 20 mg by mouth at bedtime.     Multiple Vitamins-Minerals (  MULTIVITAMIN GUMMIES WOMENS) CHEW Chew 2 each by mouth daily.      (Not in a hospital admission)   No family history on file.   Review of Systems:      Cardiac Review of Systems: Y or  [    ]= no  Chest Pain [ Y, right sided, now just due to tube   ]  Resting SOB [ Y  ] Exertional SOB  [  ]  Orthopnea [  ]   Pedal Edema [   ]    Palpitations [  ] Syncope  [  ]   Presyncope [   ]  General Review of Systems: [Y] = yes [  ]=no Constitional: recent weight change [  ]; anorexia [  ]; fatigue [  ]; nausea [ N ]; night sweats [  ]; fever [  ]; or chills [  ]                                                               Dental: Last Dentist visit:   Eye : blurred vision [  ]; diplopia [   ]; vision changes [  ];  Amaurosis fugax[  ]; Resp: cough [  ];  wheezing[  ];  hemoptysis[  ]; shortness of breath[ Y ]; paroxysmal nocturnal dyspnea[  ]; dyspnea on  exertion[  ]; or orthopnea[  ];  GI:  gallstones[  ], vomiting[  ];  dysphagia[  ]; melena[  ];  hematochezia [  ]; heartburn[  ];   Hx of  Colonoscopy[  ]; GU: kidney stones [  ]; hematuria[  ];   dysuria [  ];  nocturia[  ];  history of     obstruction [  ]; urinary frequency [  ]             Skin: rash, swelling[  ];, hair loss[  ];  peripheral edema[  ];  or itching[  ]; Musculosketetal: myalgias[  ];  joint swelling[  ];  joint erythema[  ];  joint pain[  ];  back pain[  ];  Heme/Lymph: bruising[  ];  bleeding[  ];  anemia[  ];  Neuro: TIA[  ];  headaches[  ];  stroke[  ];  vertigo[  ];  seizures[  ];   paresthesias[  ];  difficulty walking[  ];  Psych:depression[  ]; anxiety[  ];  Endocrine: diabetes[ N ];  thyroid dysfunction[N  ];        Physical Exam: BP 108/64   Pulse 80   Temp 98 F (36.7 C) (Oral)   Resp 15   Ht 5' 8 (1.727 m)   Wt 96.6 kg   SpO2 100%   BMI 32.39 kg/m   General appearance: alert, cooperative, and no distress Resp: clear to auscultation bilaterally Cardio: regular rate and rhythm Neurologic: Grossly normal  Diagnostic Studies & Laboratory data:     Recent Radiology Findings:   DG Chest Portable 1 View Result Date: 11/03/2024 EXAM: 1 VIEW(S) XRAY OF THE CHEST 11/03/2024 03:19:00 AM COMPARISON: 11/03/2024 CLINICAL HISTORY: Reassessment of PTX FINDINGS: LINES, TUBES AND DEVICES: Right chest tube in place. LUNGS AND PLEURA: No focal pulmonary opacity. No pleural effusion. Right pneumothorax has been largely evacuated with a trace apical  component remaining. HEART AND MEDIASTINUM: No acute abnormality of the cardiac and mediastinal silhouettes. BONES AND SOFT TISSUES: No acute osseous abnormality. IMPRESSION: 1. Right pneumothorax has been largely evacuated with a trace apical component remaining. 2. Right chest tube in place. Electronically signed by: Dorethia Molt MD 11/03/2024 03:41 AM EST RP Workstation: [id number]   DG Chest Portable 1 View Result  Date: 11/03/2024 EXAM: 1 VIEW(S) XRAY OF THE CHEST 11/03/2024 01:44:43 AM COMPARISON: 11/03/2024 CLINICAL HISTORY: sob FINDINGS: LINES, TUBES AND DEVICES: Right chest tube in place. LUNGS AND PLEURA: Increased moderate to large right pneumothorax. No focal pulmonary opacity. No pleural effusion. HEART AND MEDIASTINUM: The mediastinal silhouette demonstrates a slight leftward shift, which is artifactual and related to patient rotation on this examination. No acute abnormality of the cardiac silhouette. BONES AND SOFT TISSUES: No acute osseous abnormality. IMPRESSION: 1. Increased moderate to large right pneumothorax. 2. Right chest tube now  in place. Electronically signed by: Dorethia Molt MD 11/03/2024 02:04 AM EST RP Workstation: [id number]   DG Chest Portable 1 View Result Date: 11/03/2024 EXAM: 1 VIEW(S) XRAY OF THE CHEST 11/03/2024 12:22:11 AM COMPARISON: Chest x-ray 10/27/2024, CT chest 11/02/2024. CLINICAL HISTORY: SOB, post chest tube. FINDINGS: LUNGS AND PLEURA: No focal pulmonary opacity. No pleural effusion. Interval worsening of a moderate to large volume right pneumothorax. HEART AND MEDIASTINUM: No acute abnormality of the cardiac and mediastinal silhouettes. BONES AND SOFT TISSUES: No acute osseous abnormality. IMPRESSION: 1. Worsening moderate to large right pneumothorax. No tension component. 2. No chest tube noted. Electronically signed by: Morgane Naveau MD 11/03/2024 12:45 AM EST RP Workstation: [id number]   CT Chest Wo Contrast Result Date: 11/02/2024 EXAM: CT CHEST WITHOUT CONTRAST 11/02/2024 08:06:03 PM TECHNIQUE: CT of the chest was performed without the administration of intravenous contrast. Multiplanar reformatted images are provided for review. Automated exposure control, iterative reconstruction, and/or weight based adjustment of the mA/kV was utilized to reduce the radiation dose to as low as reasonably achievable. COMPARISON: Chest x-ray 11/02/2024. CLINICAL HISTORY:  Pneumothorax suspected. FINDINGS: MEDIASTINUM: Heart and pericardium are unremarkable. The central airways are clear. No mediastinal shift or tracheal shift to suggest tension component. LYMPH NODES: No mediastinal or axillary lymphadenopathy. No gross hilar adenopathy with limited evaluation on this noncontrast study. LUNGS AND PLEURA: Redemonstration of moderate volume right anterior pneumothorax. No left pneumothorax. Punctate calcified subpleural left upper lobe pulmonary micronodule (4:39). No focal consolidation or pulmonary edema. No pleural effusion. SOFT TISSUES/BONES: No acute abnormality of the bones or soft tissues. No acute rib fracture. UPPER ABDOMEN: Limited images of the upper abdomen demonstrates no acute abnormality. IMPRESSION: 1. Moderate volume right anterior pneumothorax without tension component. Electronically signed by: Morgane Naveau MD 11/02/2024 09:03 PM EST RP Workstation: [id number]   DG Chest 2 View Result Date: 11/02/2024 EXAM: 2 VIEW(S) XRAY OF THE CHEST 11/02/2024 04:39:20 PM COMPARISON: Chest x-ray on 08/29/2023. CLINICAL HISTORY: chest pain chest pain FINDINGS: Patient slightly rotated. LUNGS AND PLEURA: Moderate-sized right apical pneumothorax. No focal pulmonary opacity. No pleural effusion. HEART AND MEDIASTINUM: Unchanged cardiomediastinal silhouette. Right heart border not visualized likely due to patient rotation. No tracheal deviation. BONES AND SOFT TISSUES: No acute osseous abnormality. IMPRESSION: 1. Moderate-sized right apical pneumothorax. No definite tension component. PRA call report. 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 Pigtail in place with trace residual right apicolateral pneumothorax.. currently on suction w/o air leak  CT scan reviewed and shows no evidence of pulmonary bullae.  Could be catamenial in nature.. patient doesn't have menses due to implanon in place... Dr. Shyrl to review and determine whether to continue conservative management vs. Offering VATS for apical pleurectomy, mechanical pleurodesis.. She is agreeable to proceed with surgery if indicated.  I  spent 40 minutes counseling the patient face to face.   Tyrian Peart, PA-C 11/03/2024 2:41 PM    Agree OR for RATS wedge, mechanical pleurodesis  Harrell O Lightfoot

## 2024-11-03 NOTE — ED Notes (Signed)
 Pt ambulated to bathroom with no assistance. Mother will stay in bathroom with pt until finished

## 2024-11-03 NOTE — ED Provider Notes (Signed)
 Care of patient received from prior provider at 6:31 AM, please see their note for complete H/P and care plan.  Received handoff per ED course.  Clinical Course as of 11/03/24 0631  Mon Nov 02, 2024  2105 I have low suspicion for ACS at this time, will hold on troponin.  Low suspicion for PE, patient heart rate significantly improved while resting comfortably in bed. [BS]  Tue Nov 03, 2024  0205 DG Chest Portable 1 View Tube externally kinked. Now corrected [CC]    Clinical Course User Index [BS] Arlee Katz, MD [CC] Jerral Meth, MD   CRITICAL CARE Performed by: Meth Jerral   Total critical care time: 45 minutes  Critical care time was exclusive of separately billable procedures and treating other patients.  Critical care was necessary to treat or prevent imminent or life-threatening deterioration.  Critical care was time spent personally by me on the following activities: development of treatment plan with patient and/or surrogate as well as nursing, discussions with consultants, evaluation of patient's response to treatment, examination of patient, obtaining history from patient or surrogate, ordering and performing treatments and interventions, ordering and review of laboratory studies, ordering and review of radiographic studies, pulse oximetry and re-evaluation of patient's condition.  CHEST TUBE INSERTION  Date/Time: 11/03/2024 6:28 AM  Performed by: Jerral Meth, MD Authorized by: Jerral Meth, MD   Consent:    Consent obtained:  Verbal   Consent given by:  Patient   Risks, benefits, and alternatives were discussed: yes     Risks discussed:  Bleeding, damage to surrounding structures, infection, incomplete drainage, nerve damage and pain   Alternatives discussed:  No treatment and observation Procedure details:    Placement location:  R lateral   Tube size (Fr):  16   Tube connected to:  Suction   Drainage characteristics:  Air  only Post-procedure details:    Post-insertion x-ray findings: tube in good position     Procedure completion:  Tolerated .Sedation  Date/Time: 11/03/2024 6:30 AM  Performed by: Jerral Meth, MD Authorized by: Jerral Meth, MD   Consent:    Consent obtained:  Verbal   Consent given by:  Patient   Risks discussed:  Dysrhythmia, inadequate sedation, nausea, vomiting, respiratory compromise necessitating ventilatory assistance and intubation, prolonged hypoxia resulting in organ damage and allergic reaction   Alternatives discussed:  Analgesia without sedation Universal protocol:    Immediately prior to procedure, a time out was called: yes     Patient identity confirmed:  Anonymous protocol, patient vented/unresponsive and arm band Indications:    Procedure performed:  Chest tube placement   Procedure necessitating sedation performed by:  Physician performing sedation Pre-sedation assessment:    Time since last food or drink:  6 hours   ASA classification: class 2 - patient with mild systemic disease     Mallampati score:  I - soft palate, uvula, fauces, pillars visible   Pre-sedation assessments completed and reviewed: airway patency, cardiovascular function, hydration status, mental status, nausea/vomiting, pain level, respiratory function and temperature   A pre-sedation assessment was completed prior to the start of the procedure Procedure details (see MAR for exact dosages):    Sedation:  Ketamine    Intended level of sedation: deep   Total Provider sedation time (minutes):  25 Post-procedure details:   A post-sedation assessment was completed following the completion of the procedure.   Attendance: Constant attendance by certified staff until patient recovered     Patient is stable for discharge or  admission: yes     Procedure completion:  Tolerated well, no immediate complications   Reassessment: I received care of this patient from the prior team. She has a  pneumothorax that requires intervention.  Supervised resident physician from prior team attempting chest tube placement.  Unfortunately unsuccessful at that time.  However patient did have improvement of her pain and improvement of her shortness of breath and there was a substantial evacuation of air during the residents attempted procedure.  We repeated the chest x-ray that unfortunately showed no interval improvement in the pneumothorax. Consented the patient for a second administration of a chest tube. Performed as above successfully.  Repeat x-ray again showed worsening pneumothorax.  I reevaluated the patient at bedside and it appears that the tube was externally clamped.  The clamp was removed and a follow-up x-ray was performed showing substantial improvement of her pneumothorax.  Patient resting comfortably at this time, arrange for admission to medicine.  Pulmonology consulted by prior team who will evaluate in AM.  Disposition:   Based on the above findings, I believe this patient is stable for admission.    Patient/family educated about specific findings on our evaluation and explained exact reasons for admission.  Patient/family educated about clinical situation and time was allowed to answer questions.   Admission team communicated with and agreed with need for admission. Patient admitted. Patient ready to move at this time.     Emergency Department Medication Summary:   Medications  HYDROmorphone (DILAUDID) injection 0.5 mg (0.5 mg Intravenous Given 11/03/24 0523)  enoxaparin (LOVENOX) injection 40 mg (has no administration in time range)  potassium chloride  SA (KLOR-CON  M) CR tablet 20 mEq (20 mEq Oral Given 11/03/24 0523)  oxyCODONE (Oxy IR/ROXICODONE) immediate release tablet 5-10 mg (has no administration in time range)  acetaminophen  (TYLENOL ) tablet 500 mg (has no administration in time range)  prochlorperazine (COMPAZINE) injection 5 mg (has no administration in time range)   melatonin tablet 5 mg (has no administration in time range)  polyethylene glycol (MIRALAX / GLYCOLAX) packet 17 g (has no administration in time range)  fentaNYL (SUBLIMAZE) injection 50 mcg (50 mcg Intravenous Given 11/02/24 2324)  LORazepam  (ATIVAN ) tablet 1 mg (1 mg Oral Given 11/02/24 2235)  ketamine  50 mg in normal saline 5 mL (10 mg/mL) syringe (70 mg Intravenous Given 11/03/24 0136)  lidocaine (PF) (XYLOCAINE) 1 % injection 10 mL (10 mLs Other Given by Other 11/02/24 2325)  ketamine  (KETALAR ) injection (10 mg Intravenous Given 11/02/24 2354)  HYDROmorphone (DILAUDID) injection 1 mg (1 mg Intravenous Given 11/03/24 0046)  ondansetron  (ZOFRAN ) injection 4 mg (4 mg Intravenous Given 11/03/24 0045)      Jerral Meth, MD 11/03/24 [phone]

## 2024-11-04 ENCOUNTER — Inpatient Hospital Stay (HOSPITAL_COMMUNITY)

## 2024-11-04 DIAGNOSIS — J9383 Other pneumothorax: Secondary | ICD-10-CM | POA: Diagnosis not present

## 2024-11-04 LAB — BASIC METABOLIC PANEL WITH GFR
Anion gap: 9 (ref 5–15)
BUN: 10 mg/dL (ref 6–20)
CO2: 22 mmol/L (ref 22–32)
Calcium: 8.6 mg/dL — ABNORMAL LOW (ref 8.9–10.3)
Chloride: 106 mmol/L (ref 98–111)
Creatinine, Ser: 0.66 mg/dL (ref 0.44–1.00)
GFR, Estimated: >60 mL/min (ref >60)
Glucose, Bld: 124 mg/dL — ABNORMAL HIGH (ref 70–99)
Potassium: 3.9 mmol/L (ref 3.5–5.1)
Sodium: 137 mmol/L (ref 135–145)

## 2024-11-04 LAB — MISC LABCORP TEST (SEND OUT): Labcorp test code: 83935

## 2024-11-04 MED ORDER — OXYCODONE-ACETAMINOPHEN 7.5-325 MG PO TABS: 1.0000 | ORAL_TABLET | ORAL | Status: DC | PRN

## 2024-11-04 MED ORDER — ESCITALOPRAM OXALATE 10 MG PO TABS
20.0000 mg | ORAL_TABLET | Freq: Every day | ORAL | Status: DC
  Administered 2024-11-04 – 2024-11-07 (×4): 20 mg via ORAL
  Filled 2024-11-04 (×2): qty 2
  Filled 2024-11-04: qty 1
  Filled 2024-11-04: qty 2

## 2024-11-04 MED ORDER — SODIUM CHLORIDE 0.9% FLUSH
10.0000 mL | Freq: Three times a day (TID) | INTRAVENOUS | Status: DC
  Administered 2024-11-04 – 2024-11-05 (×3): 10 mL via INTRAPLEURAL

## 2024-11-04 MED ORDER — OXYCODONE-ACETAMINOPHEN 7.5-325 MG PO TABS
1.0000 | ORAL_TABLET | ORAL | Status: DC
  Administered 2024-11-04: 1 via ORAL
  Filled 2024-11-04 (×2): qty 1

## 2024-11-04 NOTE — ED Notes (Signed)
 The provider was notified of the patient's refusal of lovenox .

## 2024-11-04 NOTE — Progress Notes (Signed)
 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, broadcasting/film/video teaches first grade ]history of depression no history of connective tissue disorder Previous pneumothorax 06/19/2023 started after swimming-she had chest tube placed that was removed about 2 days later She followed in the office with pulmonary 08/29/2023 she still experience some dyspnea-PFTs were performed and on office visit repeat 12/02/2023 no obstruction no significant bronchodilator response no restriction normal diffusing capacity    Chronology  11/18 admitted and pulmonary consulted CVTS input pending regarding operative management    Pertinent imaging/studies till date  Imaging studies:  CXR 11/02/2024 moderate size right apical pneumothorax no definite tension component CT chest moderate volume right anterior pneumothorax without tension component   Assessment  & Plan :    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 Mild hypokalemia Replaced with K-Dur twice daily-repeat labs later this morning Depression Continue Lexapro  20 mg   Data Reviewed today:  WBC 11.4 hemoglobin 13.5 platelet 304   DVT prophylaxis: Lovenox   Status is: Inpatient Inpatient pending planning regarding pneumothorax    Dispo/Global plan: Unclear   Time 15   Subjective:   Pain is not controlled on oral opiates and she got little nauseous Otherwise she feels fine states pain is about 6/10 Passing urine stool    Objective + exam Vitals:   11/04/24 0200 11/04/24 0300 11/04/24 0500 11/04/24 0526   BP: (!) 105/58 (!) 104/57 114/65   Pulse: 83 73 78   Resp: 11 10 12    Temp:    98.3 F (36.8 C)  TempSrc:      SpO2: 100% 99% 100%   Weight:      Height:       Filed Weights   11/02/24 1841  Weight: 96.6 kg     Examination: EOMI NCAT S1-S2 no murmur Breath sounds more dull posterior laterally on the right side below tube Not much coming out of [place] No lower extremity edema     Scheduled Meds:  enoxaparin  (LOVENOX ) injection  40 mg Subcutaneous Q24H   escitalopram   20 mg Oral QHS   naproxen   500 mg Oral BID WC   Continuous Infusions: acetaminophen , HYDROmorphone  (DILAUDID ) injection, melatonin, oxyCODONE -acetaminophen , polyethylene glycol, prochlorperazine   Jai-Gurmukh Kash [place], MD  [facility]

## 2024-11-04 NOTE — TOC Initial Note (Signed)
 Transition of Care [facility]) - Initial/Assessment Note    Patient Details  Name: Crystal Mcdonald MRN: [id number] Date of Birth: 1996-01-26  [facility] CM/SW Contact:    Marval Gell, RN Phone Number: 11/04/2024, 2:56 PM  Clinical Narrative:                  Per chart review, Independent patient admitted w Spontaneous PTX, pigtail drain.  ICM will continu eto follow, no needs anticipated at this time   Expected Discharge Plan: Home/Self Care Barriers to Discharge: Continued Medical Work up   Patient Goals and CMS Choice            Expected Discharge Plan and Services   Discharge Planning Services: CM Consult   Living arrangements for the past 2 months: Single Family Home                                      Prior Living Arrangements/Services Living arrangements for the past 2 months: Single Family Home                     Activities of Daily Living   ADL Screening (condition at time of admission) Independently performs ADLs?: No Does the patient have a NEW difficulty with bathing/dressing/toileting/self-feeding that is expected to last >3 days?: No Does the patient have a NEW difficulty with getting in/out of bed, walking, or climbing stairs that is expected to last >3 days?: No Does the patient have a NEW difficulty with communication that is expected to last >3 days?: No Is the patient deaf or have difficulty hearing?: No Does the patient have difficulty seeing, even when wearing glasses/contacts?: No Does the patient have difficulty concentrating, remembering, or making decisions?: No  Permission Sought/Granted                  Emotional Assessment              Admission diagnosis:  Shortness of breath [R06.02] Spontaneous pneumothorax [J93.83] Pneumothorax on right [J93.9] Patient Active Problem List   Diagnosis Date Noted   Dyspnea 08/29/2023   Spontaneous pneumothorax 06/19/2023   Depression 06/19/2023   PCP:   Stephanie [name], MD Pharmacy:   [facility] [address] [facility] - [place], [place] - [address] [place] [address] Phone: [phone] Fax: [phone]     Social Drivers of Health (SDOH) Social History: SDOH Screenings   Food Insecurity: No Food Insecurity (11/04/2024)  Housing: Unknown (11/04/2024)  Transportation Needs: No Transportation Needs (11/04/2024)  Utilities: Not At Risk (11/04/2024)  Social Connections: Socially Isolated (11/04/2024)  Tobacco Use: Unknown (12/02/2023)   SDOH Interventions:     Readmission Risk Interventions     No data to display

## 2024-11-04 NOTE — Plan of Care (Signed)
   Problem: Education: Goal: Knowledge of General Education information will improve Description: Including pain rating scale, medication(s)/side effects and non-pharmacologic comfort measures Outcome: Progressing   Problem: Activity: Goal: Risk for activity intolerance will decrease Outcome: Progressing   Problem: Nutrition: Goal: Adequate nutrition will be maintained Outcome: Progressing   Problem: Coping: Goal: Level of anxiety will decrease Outcome: Progressing

## 2024-11-04 NOTE — Progress Notes (Signed)
   NAME:  Crystal Mcdonald, MRN:  [id number], DOB:  August 27, 1996, LOS: 1 ADMISSION DATE:  11/02/2024, CONSULTATION DATE: 11/03/2024 REFERRING MD: Dr. Cindy, CHIEF COMPLAINT: Recurrent pneumothorax  History of Present Illness:  Patient came in with a few days history of right-sided chest pain   Past history of pneumothorax about a year ago that was managed with a chest tube She was not performing any stressful exercises prior to onset   Pneumothorax 06/19/2023 following swimming, had a chest tube for 2 days   Followed up in the pulmonary office No underlying disease noted, not a smoker  Pertinent  Medical History   Past Medical History:  Diagnosis Date   Depression      Significant Hospital Events: Including procedures, antibiotic start and stop dates in addition to other pertinent events   CT chest 11/02/2024 with moderate pneumothorax  Interim History / Subjective:  Feels fine, some soreness around chest tube site but otherwise feels well  Objective    Blood pressure 102/64, pulse 77, temperature 98.1 F (36.7 C), temperature source Oral, resp. rate 13, height 5' 8 (1.727 m), weight 96.6 kg, SpO2 99%.       No intake or output data in the 24 hours ending 11/04/24 1439 Filed Weights   11/02/24 1841  Weight: 96.6 kg    Examination: General: Young lady, does not appear to be in distress HENT: Moist oral mucosa Lungs: Fair air entry bilaterally Cardiovascular: S1-S2 appreciated Abdomen: Soft, bowel sounds appreciated Extremities: No clubbing, no edema Neuro: Alert, oriented x 3 GU: Nonfocal  I reviewed last 24 h vitals and pain scores, last 48 h intake and output, last 24 h labs and trends, and last 24 h imaging results.  Pulmonary function test on 12/02/2023 with minimal obstructive disease with no significant bronchodilator response  Resolved problem list   Assessment and Plan   Recurrent pneumothorax, non-smoker, no underlying lung disease  Risk of recurrence  is high with conservative management Outcome is best with VATS pleurodesis Evaluated by cardiothoracic team, awaiting attending input  Continue chest tube to suction

## 2024-11-05 ENCOUNTER — Inpatient Hospital Stay (HOSPITAL_COMMUNITY): Admitting: Anesthesiology

## 2024-11-05 ENCOUNTER — Encounter (HOSPITAL_COMMUNITY): Payer: Self-pay | Admitting: Internal Medicine

## 2024-11-05 ENCOUNTER — Encounter (HOSPITAL_COMMUNITY)
Admission: EM | Disposition: A | Discharge status: Home / Self Care | Payer: Self-pay | Source: Home / Self Care | Attending: Thoracic Surgery (Cardiothoracic Vascular Surgery)

## 2024-11-05 ENCOUNTER — Inpatient Hospital Stay (HOSPITAL_COMMUNITY)

## 2024-11-05 DIAGNOSIS — J9311 Primary spontaneous pneumothorax: Secondary | ICD-10-CM | POA: Diagnosis not present

## 2024-11-05 DIAGNOSIS — Z09 Encounter for follow-up examination after completed treatment for conditions other than malignant neoplasm: Secondary | ICD-10-CM

## 2024-11-05 DIAGNOSIS — J439 Emphysema, unspecified: Secondary | ICD-10-CM | POA: Diagnosis not present

## 2024-11-05 DIAGNOSIS — J9383 Other pneumothorax: Secondary | ICD-10-CM | POA: Diagnosis not present

## 2024-11-05 HISTORY — PX: PLEURADESIS: SHX6030

## 2024-11-05 HISTORY — PX: WEDGE RESECTION, LUNG, ROBOT-ASSISTED, THORACOSCOPIC: SHX7655

## 2024-11-05 LAB — CBC WITH DIFFERENTIAL/PLATELET
Abs Immature Granulocytes: 0.01 K/uL (ref 0.00–0.07)
Basophils Absolute: 0 K/uL (ref 0.0–0.1)
Basophils Relative: 0 %
Eosinophils Absolute: 0.3 K/uL (ref 0.0–0.5)
Eosinophils Relative: 4 %
HCT: 40.4 % (ref 36.0–46.0)
Hemoglobin: 13.5 g/dL (ref 12.0–15.0)
Immature Granulocytes: 0 %
Lymphocytes Relative: 32 %
Lymphs Abs: 3 K/uL (ref 0.7–4.0)
MCH: 30.5 pg (ref 26.0–34.0)
MCHC: 33.4 g/dL (ref 30.0–36.0)
MCV: 91.2 fL (ref 80.0–100.0)
Monocytes Absolute: 0.6 K/uL (ref 0.1–1.0)
Monocytes Relative: 6 %
Neutro Abs: 5.4 K/uL (ref 1.7–7.7)
Neutrophils Relative %: 58 %
Platelets: 333 K/uL (ref 150–400)
RBC: 4.43 MIL/uL (ref 3.87–5.11)
RDW: 12 % (ref 11.5–15.5)
WBC: 9.4 K/uL (ref 4.0–10.5)
nRBC: 0 % (ref 0.0–0.2)

## 2024-11-05 LAB — SURGICAL PCR SCREEN
MRSA, PCR: NEGATIVE
Staphylococcus aureus: NEGATIVE

## 2024-11-05 LAB — BASIC METABOLIC PANEL WITH GFR
Anion gap: 10 (ref 5–15)
BUN: 15 mg/dL (ref 6–20)
CO2: 23 mmol/L (ref 22–32)
Calcium: 9 mg/dL (ref 8.9–10.3)
Chloride: 104 mmol/L (ref 98–111)
Creatinine, Ser: 0.66 mg/dL (ref 0.44–1.00)
GFR, Estimated: >60 mL/min (ref >60)
Glucose, Bld: 103 mg/dL — ABNORMAL HIGH (ref 70–99)
Potassium: 3.8 mmol/L (ref 3.5–5.1)
Sodium: 137 mmol/L (ref 135–145)

## 2024-11-05 LAB — TYPE AND SCREEN
ABO/RH(D): O POS
Antibody Screen: NEGATIVE

## 2024-11-05 LAB — ABO/RH: ABO/RH(D): O POS

## 2024-11-05 SURGERY — WEDGE RESECTION, LUNG, ROBOT-ASSISTED, THORACOSCOPIC
Anesthesia: General | Site: Chest | Laterality: Right

## 2024-11-05 MED ORDER — ONDANSETRON HCL 4 MG/2ML IJ SOLN
INTRAMUSCULAR | Status: DC | PRN
  Administered 2024-11-05: 4 mg via INTRAVENOUS

## 2024-11-05 MED ORDER — BISACODYL 5 MG PO TBEC
10.0000 mg | DELAYED_RELEASE_TABLET | Freq: Every day | ORAL | Status: DC
  Administered 2024-11-05 – 2024-11-06 (×2): 10 mg via ORAL
  Filled 2024-11-05 (×4): qty 2

## 2024-11-05 MED ORDER — FENTANYL CITRATE (PF) 250 MCG/5ML IJ SOLN
INTRAMUSCULAR | Status: AC
  Filled 2024-11-05: qty 5

## 2024-11-05 MED ORDER — FENTANYL CITRATE (PF) 250 MCG/5ML IJ SOLN
INTRAMUSCULAR | Status: DC | PRN
  Administered 2024-11-05: 150 ug via INTRAVENOUS

## 2024-11-05 MED ORDER — 0.9 % SODIUM CHLORIDE (POUR BTL) OPTIME
TOPICAL | Status: DC | PRN
  Administered 2024-11-05: 1000 mL

## 2024-11-05 MED ORDER — ACETAMINOPHEN 10 MG/ML IV SOLN
INTRAVENOUS | Status: AC
Start: 2024-11-05 — End: 2024-11-05
  Filled 2024-11-05: qty 100

## 2024-11-05 MED ORDER — PANTOPRAZOLE SODIUM 40 MG PO TBEC
40.0000 mg | DELAYED_RELEASE_TABLET | Freq: Every day | ORAL | Status: DC
  Administered 2024-11-06 – 2024-11-08 (×3): 40 mg via ORAL
  Filled 2024-11-05 (×3): qty 1

## 2024-11-05 MED ORDER — TRAMADOL HCL 50 MG PO TABS
50.0000 mg | ORAL_TABLET | Freq: Four times a day (QID) | ORAL | Status: DC | PRN
  Administered 2024-11-06 – 2024-11-08 (×5): 100 mg via ORAL
  Administered 2024-11-08: 50 mg via ORAL
  Filled 2024-11-05 (×6): qty 2

## 2024-11-05 MED ORDER — BUPIVACAINE LIPOSOME 1.3 % IJ SUSP
INTRAMUSCULAR | Status: DC | PRN
  Administered 2024-11-05: 100 mL

## 2024-11-05 MED ORDER — OXYCODONE HCL 5 MG PO TABS: 5.0000 mg | ORAL_TABLET | Freq: Once | ORAL | Status: DC | PRN

## 2024-11-05 MED ORDER — ROCURONIUM BROMIDE 100 MG/10ML IV SOLN
INTRAVENOUS | Status: DC | PRN
  Administered 2024-11-05: 80 mg via INTRAVENOUS

## 2024-11-05 MED ORDER — PROPOFOL 10 MG/ML IV BOLUS
INTRAVENOUS | Status: DC | PRN
  Administered 2024-11-05: 150 mg via INTRAVENOUS

## 2024-11-05 MED ORDER — KETAMINE HCL 50 MG/5ML IJ SOSY
PREFILLED_SYRINGE | INTRAMUSCULAR | Status: AC
  Filled 2024-11-05: qty 5

## 2024-11-05 MED ORDER — ACETAMINOPHEN 500 MG PO TABS
1000.0000 mg | ORAL_TABLET | Freq: Four times a day (QID) | ORAL | Status: DC
  Administered 2024-11-06 – 2024-11-08 (×9): 1000 mg via ORAL
  Filled 2024-11-05 (×10): qty 2

## 2024-11-05 MED ORDER — FENTANYL CITRATE (PF) 100 MCG/2ML IJ SOLN
25.0000 ug | INTRAMUSCULAR | Status: DC | PRN
  Administered 2024-11-05 (×2): 50 ug via INTRAVENOUS

## 2024-11-05 MED ORDER — METHOCARBAMOL 500 MG PO TABS
750.0000 mg | ORAL_TABLET | Freq: Four times a day (QID) | ORAL | Status: DC | PRN
  Administered 2024-11-05 – 2024-11-07 (×6): 750 mg via ORAL
  Filled 2024-11-05 (×8): qty 2

## 2024-11-05 MED ORDER — CHLORHEXIDINE GLUCONATE 0.12 % MT SOLN
OROMUCOSAL | Status: AC
  Administered 2024-11-05: 15 mL via OROMUCOSAL
  Filled 2024-11-05: qty 15

## 2024-11-05 MED ORDER — SENNOSIDES-DOCUSATE SODIUM 8.6-50 MG PO TABS
1.0000 | ORAL_TABLET | Freq: Every day | ORAL | Status: DC
  Administered 2024-11-05: 1 via ORAL
  Filled 2024-11-05 (×3): qty 1

## 2024-11-05 MED ORDER — DEXAMETHASONE SOD PHOSPHATE PF 10 MG/ML IJ SOLN
INTRAMUSCULAR | Status: DC | PRN
  Administered 2024-11-05: 10 mg via INTRAVENOUS

## 2024-11-05 MED ORDER — ENOXAPARIN SODIUM 40 MG/0.4ML IJ SOSY
40.0000 mg | PREFILLED_SYRINGE | Freq: Every day | INTRAMUSCULAR | Status: DC
  Administered 2024-11-06 – 2024-11-08 (×3): 40 mg via SUBCUTANEOUS
  Filled 2024-11-05 (×3): qty 0.4

## 2024-11-05 MED ORDER — ACETAMINOPHEN 10 MG/ML IV SOLN
INTRAVENOUS | Status: AC
  Filled 2024-11-05: qty 100

## 2024-11-05 MED ORDER — CHLORHEXIDINE GLUCONATE 0.12 % MT SOLN: 15.0000 mL | Freq: Once | OROMUCOSAL | Status: AC

## 2024-11-05 MED ORDER — MORPHINE SULFATE (PF) 2 MG/ML IV SOLN
2.0000 mg | INTRAVENOUS | Status: DC | PRN
  Administered 2024-11-05 – 2024-11-06 (×6): 2 mg via INTRAVENOUS
  Filled 2024-11-05 (×6): qty 1

## 2024-11-05 MED ORDER — LACTATED RINGERS IV SOLN: INTRAVENOUS | Status: DC | PRN

## 2024-11-05 MED ORDER — SUGAMMADEX SODIUM 200 MG/2ML IV SOLN
INTRAVENOUS | Status: DC | PRN
  Administered 2024-11-05: 200 mg via INTRAVENOUS

## 2024-11-05 MED ORDER — BUPIVACAINE LIPOSOME 1.3 % IJ SUSP
INTRAMUSCULAR | Status: AC
  Filled 2024-11-05: qty 20

## 2024-11-05 MED ORDER — LIDOCAINE HCL (CARDIAC) PF 100 MG/5ML IV SOSY
PREFILLED_SYRINGE | INTRAVENOUS | Status: DC | PRN
Start: 2024-11-05 — End: 2024-11-05
  Administered 2024-11-05 (×2): 80 mg via INTRATRACHEAL

## 2024-11-05 MED ORDER — ACETAMINOPHEN 160 MG/5ML PO SOLN: 1000.0000 mg | Freq: Four times a day (QID) | ORAL | Status: DC

## 2024-11-05 MED ORDER — ORAL CARE MOUTH RINSE: 15.0000 mL | Freq: Once | OROMUCOSAL | Status: AC

## 2024-11-05 MED ORDER — CEFAZOLIN SODIUM-DEXTROSE 2-3 GM-%(50ML) IV SOLR
INTRAVENOUS | Status: DC | PRN
  Administered 2024-11-05: 2 g via INTRAVENOUS

## 2024-11-05 MED ORDER — ACETAMINOPHEN 10 MG/ML IV SOLN
1000.0000 mg | Freq: Once | INTRAVENOUS | Status: DC | PRN
  Administered 2024-11-05: 1000 mg via INTRAVENOUS

## 2024-11-05 MED ORDER — OXYCODONE HCL 5 MG PO TABS
5.0000 mg | ORAL_TABLET | ORAL | Status: DC | PRN
  Filled 2024-11-05 (×2): qty 2

## 2024-11-05 MED ORDER — KETOROLAC TROMETHAMINE 15 MG/ML IJ SOLN
15.0000 mg | Freq: Four times a day (QID) | INTRAMUSCULAR | Status: AC
  Administered 2024-11-05 – 2024-11-07 (×8): 15 mg via INTRAVENOUS
  Filled 2024-11-05 (×8): qty 1

## 2024-11-05 MED ORDER — KETAMINE HCL 10 MG/ML IJ SOLN
INTRAMUSCULAR | Status: DC | PRN
  Administered 2024-11-05: 30 mg via INTRAVENOUS

## 2024-11-05 MED ORDER — SODIUM CHLORIDE (PF) 0.9 % IJ SOLN
INTRAMUSCULAR | Status: AC
  Filled 2024-11-05: qty 50

## 2024-11-05 MED ORDER — CEFAZOLIN SODIUM-DEXTROSE 2-4 GM/100ML-% IV SOLN
2.0000 g | Freq: Three times a day (TID) | INTRAVENOUS | Status: AC
  Administered 2024-11-05 – 2024-11-06 (×2): 2 g via INTRAVENOUS
  Filled 2024-11-05 (×2): qty 100

## 2024-11-05 MED ORDER — LACTATED RINGERS IV SOLN: INTRAVENOUS | Status: DC

## 2024-11-05 MED ORDER — ONDANSETRON HCL 4 MG/2ML IJ SOLN: 4.0000 mg | Freq: Four times a day (QID) | INTRAMUSCULAR | Status: DC | PRN

## 2024-11-05 MED ORDER — FENTANYL CITRATE (PF) 100 MCG/2ML IJ SOLN
INTRAMUSCULAR | Status: AC
  Filled 2024-11-05: qty 2

## 2024-11-05 MED ORDER — BUPIVACAINE HCL (PF) 0.5 % IJ SOLN
INTRAMUSCULAR | Status: AC
  Filled 2024-11-05: qty 30

## 2024-11-05 MED ORDER — MIDAZOLAM HCL 2 MG/2ML IJ SOLN
INTRAMUSCULAR | Status: AC
  Filled 2024-11-05: qty 2

## 2024-11-05 MED ORDER — OXYCODONE HCL 5 MG/5ML PO SOLN: 5.0000 mg | Freq: Once | ORAL | Status: DC | PRN

## 2024-11-05 MED ORDER — MIDAZOLAM HCL 5 MG/5ML IJ SOLN
INTRAMUSCULAR | Status: DC | PRN
  Administered 2024-11-05: 2 mg via INTRAVENOUS

## 2024-11-05 SURGICAL SUPPLY — 51 items
CANISTER SUCTION 3000ML PPV (SUCTIONS) ×2 IMPLANT
CANNULA REDUCER 12-8 DVNC XI (CANNULA) ×2 IMPLANT
CATH THORACIC 28FR (CATHETERS) ×1 IMPLANT
CHLORAPREP W/TINT 26 (MISCELLANEOUS) ×1 IMPLANT
CNTNR URN SCR LID CUP LEK RST (MISCELLANEOUS) ×5 IMPLANT
DEFOGGER SCOPE WARM SEASHARP (MISCELLANEOUS) ×1 IMPLANT
DERMABOND ADVANCED .7 DNX12 (GAUZE/BANDAGES/DRESSINGS) ×1 IMPLANT
DRAPE ARM DVNC X/XI (DISPOSABLE) ×4 IMPLANT
DRAPE COLUMN DVNC XI (DISPOSABLE) ×1 IMPLANT
DRAPE CV SPLIT W-CLR ANES SCRN (DRAPES) ×1 IMPLANT
DRAPE SURG ORHT 6 SPLT 77X108 (DRAPES) ×1 IMPLANT
ELECT BLADE 6.5 EXT (BLADE) IMPLANT
ELECTRODE REM PT RTRN 9FT ADLT (ELECTROSURGICAL) ×1 IMPLANT
FORCEPS BPLR LNG DVNC XI (INSTRUMENTS) IMPLANT
FORCEPS CADIERE DVNC XI (FORCEP) IMPLANT
GAUZE SPONGE 4X4 12PLY STRL (GAUZE/BANDAGES/DRESSINGS) ×1 IMPLANT
GLOVE BIO SURGEON STRL SZ7.5 (GLOVE) ×2 IMPLANT
GLOVE SURG SS PI 8.0 STRL IVOR (GLOVE) ×1 IMPLANT
GOWN STRL REUS W/ TWL LRG LVL3 (GOWN DISPOSABLE) ×2 IMPLANT
GOWN STRL REUS W/ TWL XL LVL3 (GOWN DISPOSABLE) ×2 IMPLANT
GOWN STRL REUS W/TWL 2XL LVL3 (GOWN DISPOSABLE) ×1 IMPLANT
GRASPER TIP-UP FEN DVNC XI (INSTRUMENTS) IMPLANT
HEMOSTAT SURGICEL 2X14 (HEMOSTASIS) ×2 IMPLANT
KIT BASIN OR (CUSTOM PROCEDURE TRAY) ×1 IMPLANT
KIT TURNOVER KIT B (KITS) ×1 IMPLANT
NDL 22X1.5 STRL (OR ONLY) (MISCELLANEOUS) ×1 IMPLANT
NEEDLE 22X1.5 STRL (OR ONLY) (MISCELLANEOUS) ×1 IMPLANT
PACK CHEST (CUSTOM PROCEDURE TRAY) ×1 IMPLANT
PAD ARMBOARD POSITIONER FOAM (MISCELLANEOUS) ×5 IMPLANT
RELOAD STAPLE 45 3.5 BLU DVNC (STAPLE) IMPLANT
SEAL UNIV 5-12 XI (MISCELLANEOUS) ×4 IMPLANT
SET IV EXT TUBING 30 (IV SETS) ×1 IMPLANT
SET TRI-LUMEN FLTR TB AIRSEAL (TUBING) ×1 IMPLANT
SOLN 0.9% NACL POUR BTL 1000ML (IV SOLUTION) ×2 IMPLANT
SOLN STERILE WATER BTL 1000 ML (IV SOLUTION) ×1 IMPLANT
SOLUTION ELECTROSURG ANTI STCK (MISCELLANEOUS) ×1 IMPLANT
STAPLER 45 SUREFORM DVNC (STAPLE) IMPLANT
STOPCOCK 4 WAY LG BORE MALE ST (IV SETS) ×1 IMPLANT
SUT SILK 1 MH (SUTURE) ×1 IMPLANT
SUT VIC AB 2-0 CT1 TAPERPNT 27 (SUTURE) ×1 IMPLANT
SUT VIC AB 3-0 SH 27X BRD (SUTURE) ×2 IMPLANT
SUT VICRYL 0 TIES 12 18 (SUTURE) ×1 IMPLANT
SUT VICRYL 0 UR6 27IN ABS (SUTURE) ×2 IMPLANT
SYR 10ML LL (SYRINGE) ×1 IMPLANT
SYR 20ML LL LF (SYRINGE) ×1 IMPLANT
SYR 50ML LL SCALE MARK (SYRINGE) ×1 IMPLANT
SYSTEM RETRIEVAL ANCHOR 8 (MISCELLANEOUS) IMPLANT
SYSTEM SAHARA CHEST DRAIN ATS (WOUND CARE) ×1 IMPLANT
TAPE CLOTH 4X10 WHT NS (GAUZE/BANDAGES/DRESSINGS) ×1 IMPLANT
TAPE CLOTH SURG 4X10 WHT LF (GAUZE/BANDAGES/DRESSINGS) IMPLANT
TOWEL GREEN STERILE (TOWEL DISPOSABLE) ×1 IMPLANT

## 2024-11-05 NOTE — Transfer of Care (Signed)
 Immediate Anesthesia Transfer of Care Note  Patient: Crystal Mcdonald  Procedure(s) Performed: WEDGE RESECTION, LUNG, ROBOT-ASSISTED, THORACOSCOPIC (Right: Chest) MECHANICAL PLEURODESIS (Right: Chest)  Patient Location: PACU  Anesthesia Type:General  Level of Consciousness: awake, alert , oriented, and patient cooperative  Airway & Oxygen Therapy: Patient Spontanous Breathing and Patient connected to face mask oxygen  Post-op Assessment: Report given to RN, Post -op Vital signs reviewed and stable, Patient moving all extremities, and Patient moving all extremities X 4  Post vital signs: Reviewed and stable  Last Vitals:  Vitals Value Taken Time  BP 123/77 11/05/24 14:08  Temp 36.6 C 11/05/24 14:08  Pulse 97 11/05/24 14:13  Resp 17 11/05/24 14:13  SpO2 95 % 11/05/24 14:13  Vitals shown include unfiled device data.  Last Pain:  Vitals:   11/05/24 1408  TempSrc:   PainSc: 0-No pain      Patients Stated Pain Goal: 0 (11/04/24 1832)  Complications: No notable events documented.

## 2024-11-05 NOTE — Discharge Instructions (Signed)
 Discharge Instructions:  1. You may shower, please wash incisions daily with soap and water and keep dry.  If you wish to cover wounds with dressing you may do so but please keep clean and change daily.  No tub baths or swimming until incisions have completely healed.  If your incisions become red or develop any drainage please call our office at [phone]  2. No Driving until cleared by Dr. Lang office and you are no longer using narcotic pain medications  3. Fever of 101.5 for at least 24 hours with no source, please contact our office at [phone]  4. Activity- up as tolerated, please walk at least 3 times per day.  Avoid strenuous activity for several weeks on affected side  5. If any questions or concerns arise, please do not hesitate to contact our office at [phone]

## 2024-11-05 NOTE — Anesthesia Procedure Notes (Signed)
 Procedure Name: Intubation Date/Time: 11/05/2024 1:02 PM  Performed by: Arvell Edsel HERO, CRNAPre-anesthesia Checklist: Patient identified, Suction available, Patient being monitored, Timeout performed and Emergency Drugs available Patient Re-evaluated:Patient Re-evaluated prior to induction Oxygen Delivery Method: Circle system utilized Preoxygenation: Pre-oxygenation with 100% oxygen Induction Type: IV induction Ventilation: Mask ventilation without difficulty Laryngoscope Size: Mac and 3 Grade View: Grade I Endobronchial tube: 37 Fr Number of attempts: 2 Placement Confirmation: ETT inserted through vocal cords under direct vision, positive ETCO2 and breath sounds checked- equal and bilateral Tube secured with: Tape Dental Injury: Teeth and Oropharynx as per pre-operative assessment

## 2024-11-05 NOTE — Brief Op Note (Addendum)
 11/02/2024 - 11/05/2024  1:53 PM  PATIENT:  Crystal Mcdonald  28 y.o. female  PRE-OPERATIVE DIAGNOSIS:  spontaneous pneumothorax  POST-OPERATIVE DIAGNOSIS:  spontaneous pneumothorax  PROCEDURE:  Procedure(s):  ROBOTIC ASSISTED VIDEO THORACOSCOPY -Right Lower Lobe Bleb Resection  -APICAL PLEURECTOMY  -MECHANICAL PLEURODESIS (Right)  -INTERCOSTAL NERVE BLOCK  SURGEON:  Surgeons and Role:    * Lightfoot, Linnie KIDD, MD - Primary  PHYSICIAN ASSISTANT: Rocky Shad PA-C  ASSISTANTS: none   ANESTHESIA:   general  ZAO:[id number]  BLOOD ADMINISTERED:none  DRAINS: 28 Straight Chest Tube   LOCAL MEDICATIONS USED:  BUPIVICAINE   SPECIMEN:  Source of Specimen:  Right Lower Lobe Wedge  DISPOSITION OF SPECIMEN:  PATHOLOGY  COUNTS:  YES  TOURNIQUET:  * No tourniquets in log *  DICTATION: .Dragon Dictation  PLAN OF CARE: Admit to inpatient   PATIENT DISPOSITION:  PACU - hemodynamically stable.   Delay start of Pharmacological VTE agent (>24hrs) due to surgical blood loss or risk of bleeding: no

## 2024-11-05 NOTE — Hospital Course (Addendum)
 History of Present Illness:  Crystal Mcdonald is a 28 yo female with known history of Depression. She presented to her PCP on 11/02/2024 with complaints complaints of shortness of breath. This had been occurring since 11/15 and is associated with chest pain with breathing, when she lays down, or walks too much. She also complains of pain in her right chest and right side of her back. She denied being sick recently she did admit to previous right sided pneumothorax about 1 year prior. Chest xray obtained showed a small pneumothorax. She was subsequently referred to the ED for chest tube placement. This was done with initial post placement chest xray showing the tube to be kinked, the tube was repositioned and admitted to the hospital for further care.   Hospital Course:  Due to patient experiencing recurrent pneumothorax.  Cardiothoracic surgery consultation was requested.  She was evaluated by Dr. Shyrl at which time she only complained of some pain at her chest tube site.  She is a lifelong non-smoker.  She was offered Robotic Assisted Video Thoracoscopy with wedge and mechanical pleurodesis.  The risks and benefits of the procedure were explained to the patient and she was agreeable to proceed.  She was taken to the operating room on 11/05/2024.  She underwent Robotic Assisted Right Video Thoracoscopy with Resection of Right Lower Bleb, Apical pleurectomy, mechanical pleurodesis, intercostal nerve block.  She tolerated the procedure without difficulty, was extubated, and taken to the SICU in stable condition.  The patient did well post operatively.  Her chest tube was continued to suction for 48 hours.  Her chest xray did not demonstrate pneumothorax.  She was transitioned to water seal on postop day #2.  Her chest tube was able to be removed on postop day #3.  Follow up chest xray showed no pneumothorax.

## 2024-11-05 NOTE — Discharge Summary (Signed)
 [address] [place] [address]             [phone]        Physician Discharge Summary  Patient ID: JAMARIAH TONY MRN: [id number] DOB/AGE: 13-Jul-1996 28 y.o.  Admit date: 11/02/2024 Discharge date: 11/09/2024  Admission Diagnoses:  Patient Active Problem List   Diagnosis Date Noted   Dyspnea 08/29/2023   Spontaneous pneumothorax 06/19/2023   Depression 06/19/2023   Discharge Diagnoses:   Patient Active Problem List   Diagnosis Date Noted   S/P Robotic Assisted Right Video Thoracosocpy with Bleb Resection RLL, Lymph Node Dissection, Intercostal Nerve Block, Mechanical pleurodesis 11/05/2024   Dyspnea 08/29/2023   Spontaneous pneumothorax 06/19/2023   Depression 06/19/2023   Discharged Condition: good  History of Present Illness:  Crystal Mcdonald is a 28 yo female with known history of Depression. She presented to her PCP on 11/02/2024 with complaints complaints of shortness of breath. This had been occurring since 11/15 and is associated with chest pain with breathing, when she lays down, or walks too much. She also complains of pain in her right chest and right side of her back. She denied being sick recently she did admit to previous right sided pneumothorax about 1 year prior. Chest xray obtained showed a small pneumothorax. She was subsequently referred to the ED for chest tube placement. This was done with initial post placement chest xray showing the tube to be kinked, the tube was repositioned and admitted to the hospital for further care.   Hospital Course:  Due to patient experiencing recurrent pneumothorax.  Cardiothoracic surgery consultation was requested.  She was evaluated by Dr. Shyrl at which time she only complained of some pain at her chest tube site.  She is a lifelong non-smoker.  She was offered Robotic Assisted Video Thoracoscopy with wedge and mechanical pleurodesis.  The risks and benefits of the procedure  were explained to the patient and she was agreeable to proceed.  She was taken to the operating room on 11/05/2024.  She underwent Robotic Assisted Right Video Thoracoscopy with Resection of Right Lower Bleb, Apical pleurectomy, mechanical pleurodesis, intercostal nerve block.  She tolerated the procedure without difficulty, was extubated, and taken to the SICU in stable condition.  The patient did well post operatively.  Her chest tube was continued to suction for 48 hours.  Her chest xray did not demonstrate pneumothorax.  She was transitioned to water seal on postop day #2.  Her chest tube was able to be removed on postop day #3.  Follow up chest xray showed no pneumothorax.  She was overall felt to be quite stable for discharge at that time.  Consults: None  Significant Diagnostic Studies: radiology:   CT scan:    FINDINGS:   MEDIASTINUM: Heart and pericardium are unremarkable. The central airways are clear. No mediastinal shift or tracheal shift to suggest tension component.   LYMPH NODES: No mediastinal or axillary lymphadenopathy. No gross hilar adenopathy with limited evaluation on this noncontrast study.   LUNGS AND PLEURA: Redemonstration of moderate volume right anterior pneumothorax. No left pneumothorax. Punctate calcified subpleural left upper lobe pulmonary micronodule (4:39). No focal consolidation or pulmonary edema. No pleural effusion.   SOFT TISSUES/BONES: No acute abnormality of the bones or soft tissues. No acute rib fracture.   UPPER ABDOMEN: Limited images of the upper abdomen demonstrates no acute abnormality.   IMPRESSION: 1. Moderate volume right anterior  pneumothorax without tension component.   Electronically signed by: Morgane Naveau MD 11/02/2024 09:03 PM EST RP Workstation: [id number]  Treatments: surgery:   Patient:  Samule LOISE Hines Pre-Op Dx: spontaneous pneumothorax   Post-op Dx:  same Procedure: - Robotic assisted right video  thoracoscopy - Wedge resection of the right lower lobe - Apical pleurectomy - Mechanical pleurodesis - Intercostal nerve block   Surgeon and Role:      * Lightfoot, Linnie KIDD, MD - Primary Assistant: CHARLENA Shad, PA-C An experienced assistant was required given the complexity of this surgery and the standard of surgical care. The assistant was needed for exposure, dissection, suctioning, retraction of delicate tissues and sutures, instrument exchange and for overall help during this procedure.  Anesthesia  general EBL:  10ml   Discharge Exam: Blood pressure 127/78, pulse 78, temperature 98 F (36.7 C), temperature source Oral, resp. rate 18, height 5' 8 (1.727 m), weight 96.6 kg, SpO2 97%.  General appearance: alert, cooperative, and no distress Heart: regular rate and rhythm Lungs: clear to auscultation bilaterally Abdomen: benign Extremities: warm well perfused Wound: incis healing well Disposition: Discharge disposition: 01-Home or Self Care        Allergies as of 11/08/2024   No Known Allergies      Medication List     TAKE these medications    escitalopram  20 MG tablet Commonly known as: LEXAPRO  Take 20 mg by mouth at bedtime.   methocarbamol  750 MG tablet Commonly known as: ROBAXIN  Take 1 tablet (750 mg total) by mouth every 6 (six) hours as needed for muscle spasms.   Multivitamin Gummies Womens Chew Chew 2 each by mouth daily.   traMADol  50 MG tablet Commonly known as: ULTRAM  Take 1 tablet (50 mg total) by mouth every 6 (six) hours as needed for up to 5 days (mild pain).   Tylenol  Dissolve Packs 500 MG Pack Generic drug: Acetaminophen  Take 1,000 mg by mouth 2 (two) times daily as needed (fever, headache, pain).        Follow-up Information     Rutha Manuelita HERO, PA-C Follow up on 11/19/2024.   Specialties: Physician Assistant, Thoracic Surgery Why: Appointment is at 10:30, please get CXR at 9:45 on 2nd floor of our office building prior to  your appointment Contact information: [address] [place] [address] [phone]                 Signed: Lemond FORBES Cera, PA-C  11/09/2024, 12:25 PM

## 2024-11-05 NOTE — Anesthesia Preprocedure Evaluation (Addendum)
 Anesthesia Evaluation  Patient identified by MRN, date of birth, ID band Patient awake    Reviewed: Allergy & Precautions, NPO status , Patient's Chart, lab work & pertinent test results  History of Anesthesia Complications Negative for: history of anesthetic complications  Airway Mallampati: I  TM Distance: >3 FB Neck ROM: Full    Dental  (+) Teeth Intact, Dental Advisory Given   Pulmonary  spontaneous pneumothorax right    + decreased breath sounds      Cardiovascular negative cardio ROS  Rhythm:Regular     Neuro/Psych  PSYCHIATRIC DISORDERS  Depression    negative neurological ROS     GI/Hepatic negative GI ROS, Neg liver ROS,,,  Endo/Other  negative endocrine ROS    Renal/GU negative Renal ROS     Musculoskeletal   Abdominal   Peds  Hematology negative hematology ROS (+) Lab Results      Component                Value               Date                      WBC                      9.4                 11/05/2024                HGB                      13.5                11/05/2024                HCT                      40.4                11/05/2024                MCV                      91.2                11/05/2024                PLT                      333                 11/05/2024              Anesthesia Other Findings   Reproductive/Obstetrics Lab Results      Component                Value               Date                      PREGTESTUR               NEGATIVE            02/02/2022                PREGSERUM  NEGATIVE            11/02/2024                HCG                      <5.0                11/09/2018                                         Anesthesia Physical Anesthesia Plan  ASA: 2  Anesthesia Plan: General   Post-op Pain Management: Ofirmev  IV (intra-op)*, Toradol  IV (intra-op)* and Ketamine  IV*   Induction: Intravenous  PONV Risk  Score and Plan: 4 or greater and Ondansetron  and Dexamethasone   Airway Management Planned: Double Lumen EBT  Additional Equipment: None  Intra-op Plan:   Post-operative Plan: Extubation in OR  Informed Consent: I have reviewed the patients History and Physical, chart, labs and discussed the procedure including the risks, benefits and alternatives for the proposed anesthesia with the patient or authorized representative who has indicated his/her understanding and acceptance.     Dental advisory given  Plan Discussed with: CRNA  Anesthesia Plan Comments:          Anesthesia Quick Evaluation

## 2024-11-05 NOTE — Progress Notes (Signed)
     [address] [place] [address]             [phone]       No events  Vitals:   11/05/24 0000 11/05/24 0400  BP: 111/66 107/64  Pulse: 87 74  Resp: 13 11  Temp: 98.3 F (36.8 C) 98.3 F (36.8 C)  SpO2: 93% 94%   Alert NAD Sinus EWOB  OR for R RATS wedge, mechanical pleurodesis

## 2024-11-05 NOTE — Op Note (Signed)
      [address] [place] [address]             [phone]       11/05/2024  Patient:  Crystal Mcdonald Hines Pre-Op Dx: spontaneous pneumothorax   Post-op Dx:  same Procedure: - Robotic assisted right video thoracoscopy - Wedge resection of the right lower lobe - Apical pleurectomy - Mechanical pleurodesis - Intercostal nerve block  Surgeon and Role:      * Raymie Giammarco, Linnie KIDD, MD - Primary Assistant: CHARLENA Shad, PA-C An experienced assistant was required given the complexity of this surgery and the standard of surgical care. The assistant was needed for exposure, dissection, suctioning, retraction of delicate tissues and sutures, instrument exchange and for overall help during this procedure.  Anesthesia  general EBL:  10ml Blood Administration: none Specimen:  Wedge resection of the right lower lobe.  Apical pleura.  Drains: 34 F argyle chest tube in right chest Counts: correct   Indications: Samirah Scarpati is a 28 yo female with known history of Depression.  She presented to her PCP on 11/02/2024 with complaints complaints of shortness of breath.  This had been occurring since 11/15 and is associated with chest pain with breathing, when she lays down, or walks too much.  She also complains of pain in her right chest and right side of her back.  She denied being sick recently she did admit to previous right sided pneumothorax about 1 year prior.  Chest xray obtained showed a small pneumothorax.  She was subsequently referred to the ED for chest tube placement.  This was done with initial post placement chest xray showing the tube to be kinked,  the tube was repositioned and admitted to the hospital for further care.  She is a lifelong non-smoker.  Her previous pneumothorax was managed with a chest tube for 2 days.  The patient currently has pain at her chest tube site.  Otherwise her symptoms have resolved.    Findings: Right lower lobe bleb in apical  segment  Operative Technique: After the risks, benefits and alternatives were thoroughly discussed, the patient was brought to the operative theatre.  Anesthesia was induced, and the patient was then placed in a lateral decubitus position and was prepped and draped in normal sterile fashion.  An appropriate surgical pause was performed, and pre-operative antibiotics were dosed accordingly.  We began by placing our 3 robotic ports in the 7th intercostal space targeting the hilum of the lung.  The robot was then docked and all instruments were passed under direct visualization.    The lung was then retracted superiorly, and the inferior pulmonary ligament was divided.  The lung was freed of all pleural adhesions.  Wedge resections of the right lower lobe were performed.  The apical parietal pleural was removed with a combination of cautery and blunt dissection.  The chest was irrigated, and an air leak test was performed.  An intercostal nerve block was performed under direct visualization.  A mechanical pleurodesis was then performed.  A 27F chest with then placed, and we watch the remaining lobes re-expand.  The skin and soft tissue were closed with absorbable suture.    The patient tolerated the procedure without any immediate complications, and was transferred to the PACU in stable condition.  Tomoki Lucken KIDD Rayas

## 2024-11-05 NOTE — Plan of Care (Signed)

## 2024-11-05 NOTE — Progress Notes (Signed)
 PROGRESS NOTE        PATIENT DETAILS Name: Crystal Mcdonald Age: 28 y.o. Sex: female Date of Birth: [birth year] Admit Date: 11/02/2024 Admitting Physician Terry LOISE Hurst, DO [name], [name], MD  Brief Summary: Patient is a 28 y.o.  female with a prior history of spontaneous pneumothorax in 2024 (managed with chest tube)-presented with shortness of breath-she was found to have recurrent pneumothorax-requiring chest tube placement and subsequent admission to the hospitalist service.  Significant events: 11/18>> admit to [facility]  Significant studies: 11/17>> CT chest: Moderate volume right pneumothorax without tension.  Significant microbiology data: None  Procedures: 11/18>> right-sided chest tube placed by ED MD  Consults: PCCM Cardiothoracic surgery  Subjective: Lying comfortably in bed-denies any chest pain or shortness of breath.  Objective: Vitals: Blood pressure 101/63, pulse 73, temperature 98.4 F (36.9 C), temperature source Oral, resp. rate 14, height 5' 8 (1.727 m), weight 96.6 kg, SpO2 93%.   Exam: Gen Exam:Alert awake-not in any distress HEENT:atraumatic, normocephalic Chest: B/L clear to auscultation anteriorly CVS:S1S2 regular Abdomen:soft non tender, non distended Extremities:no edema Neurology: Non focal Skin: no rash  Pertinent Labs/Radiology:    Latest Ref Rng & Units 11/05/2024    2:32 AM 11/03/2024    5:07 AM 11/02/2024    7:48 PM  CBC  WBC 4.0 - 10.5 K/uL 9.4  11.4  10.3   Hemoglobin 12.0 - 15.0 g/dL 86.4  86.4  86.4   Hematocrit 36.0 - 46.0 % 40.4  39.6  40.2   Platelets 150 - 400 K/uL 333  304  324     Lab Results  Component Value Date   NA 137 11/05/2024   K 3.8 11/05/2024   CL 104 11/05/2024   CO2 23 11/05/2024      Assessment/Plan: Recurrent spontaneous pneumothorax (second episode) No issues overnight Chest tube in place PCCM following Cardiothoracic surgery planning OR today  Mood  disorder Appears stable Lexapro   Class 1 Obesity: Estimated body mass index is 32.39 kg/m as calculated from the following:   Height as of this encounter: 5' 8 (1.727 m).   Weight as of this encounter: 96.6 kg.   Code status:   Code Status: Full Code   DVT Prophylaxis: enoxaparin (LOVENOX) injection 40 mg Start: 11/03/24 1000   Family Communication: None at bedside   Disposition Plan: Status is: Inpatient Remains inpatient appropriate because: Severity of illness   Planned Discharge Destination:Home   Diet: Diet Order             Diet NPO time specified  Diet effective midnight                     Antimicrobial agents: Anti-infectives (From admission, onward)    None        MEDICATIONS: Scheduled Meds:  enoxaparin (LOVENOX) injection  40 mg Subcutaneous Q24H   escitalopram   20 mg Oral QHS   naproxen  500 mg Oral BID WC   oxyCODONE-acetaminophen   1 tablet Oral Q4H   sodium chloride  flush  10 mL Intrapleural Q8H   Continuous Infusions: PRN Meds:.acetaminophen , HYDROmorphone (DILAUDID) injection, melatonin, polyethylene glycol, prochlorperazine   I have personally reviewed following labs and imaging studies  LABORATORY DATA: CBC: Recent Labs  Lab 11/02/24 1948 11/03/24 0507 11/05/24 0232  WBC 10.3 11.4* 9.4  NEUTROABS 7.5  --  5.4  HGB 13.5 13.5 13.5  HCT 40.2 39.6 40.4  MCV 89.5 90.2 91.2  PLT 324 304 333    Basic Metabolic Panel: Recent Labs  Lab 11/02/24 1948 11/03/24 0507 11/04/24 0916 11/05/24 0232  NA 138  --  137 137  K 3.3*  --  3.9 3.8  CL 108  --  106 104  CO2 20*  --  22 23  GLUCOSE 110*  --  124* 103*  BUN 11  --  10 15  CREATININE 0.80 0.76 0.66 0.66  CALCIUM 9.3  --  8.6* 9.0    GFR: Estimated Creatinine Clearance: 127.3 mL/min (by C-G formula based on SCr of 0.66 mg/dL).  Liver Function Tests: No results for input(s): AST, ALT, ALKPHOS, BILITOT, PROT, ALBUMIN in the last 168 hours. No results  for input(s): LIPASE, AMYLASE in the last 168 hours. No results for input(s): AMMONIA in the last 168 hours.  Coagulation Profile: No results for input(s): INR, PROTIME in the last 168 hours.  Cardiac Enzymes: No results for input(s): CKTOTAL, CKMB, CKMBINDEX, TROPONINI in the last 168 hours.  BNP (last 3 results) No results for input(s): PROBNP in the last 8760 hours.  Lipid Profile: No results for input(s): CHOL, HDL, LDLCALC, TRIG, CHOLHDL, LDLDIRECT in the last 72 hours.  Thyroid Function Tests: No results for input(s): TSH, T4TOTAL, FREET4, T3FREE, THYROIDAB in the last 72 hours.  Anemia Panel: No results for input(s): VITAMINB12, FOLATE, FERRITIN, TIBC, IRON, RETICCTPCT in the last 72 hours.  Urine analysis:    Component Value Date/Time   COLORURINE YELLOW 02/02/2022 1618   APPEARANCEUR CLEAR 02/02/2022 1618   LABSPEC 1.012 02/02/2022 1618   PHURINE 5.0 02/02/2022 1618   GLUCOSEU NEGATIVE 02/02/2022 1618   HGBUR SMALL (A) 02/02/2022 1618   BILIRUBINUR NEGATIVE 02/02/2022 1618   KETONESUR NEGATIVE 02/02/2022 1618   PROTEINUR NEGATIVE 02/02/2022 1618   NITRITE NEGATIVE 02/02/2022 1618   LEUKOCYTESUR TRACE (A) 02/02/2022 1618    Sepsis Labs: Lactic Acid, Venous No results found for: LATICACIDVEN  MICROBIOLOGY: No results found for this or any previous visit (from the past 240 hours).  RADIOLOGY STUDIES/RESULTS: DG CHEST PORT 1 VIEW Result Date: 11/04/2024 EXAM: 1 VIEW(S) XRAY OF THE CHEST 11/04/2024 06:56:00 PM COMPARISON: 11/04/2024 02:54 PM CLINICAL HISTORY: Chest tube in place FINDINGS: LINES, TUBES AND DEVICES: Stable right pigtail pleural drainage catheter overlying the right mid lung. LUNGS AND PLEURA: No focal pulmonary opacity. No pleural effusion. No remaining radiographic evidence of pneumothorax. HEART AND MEDIASTINUM: No acute abnormality of the cardiac and mediastinal silhouettes. BONES AND SOFT  TISSUES: No acute osseous abnormality. IMPRESSION: 1. No remaining radiographic evidence of pneumothorax. 2. Stable right pigtail pleural drainage catheter overlying the right mid lung. Electronically signed by: Morgane Naveau MD 11/04/2024 11:42 PM EST RP Workstation: [id number]   DG CHEST PORT 1 VIEW Result Date: 11/04/2024 CLINICAL DATA:  [id number] Pneumothorax [id number] EXAM: PORTABLE CHEST 1 VIEW COMPARISON:  November 02, 2024 FINDINGS: The cardiomediastinal silhouette is unchanged in contour.RIGHT-sided chest tube. No pleural effusion. No significant pneumothorax. No acute pleuroparenchymal abnormality. Mildly prominent transverse processes of C7. IMPRESSION: RIGHT-sided chest tube without significant pneumothorax. Electronically Signed   By: Corean Salter M.D.   On: 11/04/2024 15:40     LOS: 2 days   Donalda Applebaum, MD  [facility]    To contact the attending provider between 7A-7P or the covering provider during after hours 7P-7A, please log into the web site [email or web address] and access using universal [place] password  for that web site. If you do not have the password, please call the hospital operator.  11/05/2024, 9:03 AM

## 2024-11-06 ENCOUNTER — Encounter (HOSPITAL_COMMUNITY): Payer: Self-pay | Admitting: Thoracic Surgery (Cardiothoracic Vascular Surgery)

## 2024-11-06 ENCOUNTER — Inpatient Hospital Stay (HOSPITAL_COMMUNITY)

## 2024-11-06 LAB — BASIC METABOLIC PANEL WITH GFR
Anion gap: 9 (ref 5–15)
BUN: 11 mg/dL (ref 6–20)
CO2: 22 mmol/L (ref 22–32)
Calcium: 8.9 mg/dL (ref 8.9–10.3)
Chloride: 105 mmol/L (ref 98–111)
Creatinine, Ser: 0.66 mg/dL (ref 0.44–1.00)
GFR, Estimated: >60 mL/min (ref >60)
Glucose, Bld: 156 mg/dL — ABNORMAL HIGH (ref 70–99)
Potassium: 4.3 mmol/L (ref 3.5–5.1)
Sodium: 136 mmol/L (ref 135–145)

## 2024-11-06 NOTE — Progress Notes (Signed)
   NAME:  Crystal Mcdonald, MRN:  [id number], DOB:  23-Oct-1996, LOS: 3 ADMISSION DATE:  11/02/2024, CONSULTATION DATE: 11/03/2024 REFERRING MD: Dr. Cindy, CHIEF COMPLAINT: Recurrent pneumothorax  History of Present Illness:  Patient came in with a few days history of right-sided chest pain   Past history of pneumothorax about a year ago that was managed with a chest tube She was not performing any stressful exercises prior to onset   Pneumothorax 06/19/2023 following swimming, had a chest tube for 2 days   Followed up in the pulmonary office No underlying disease noted, not a smoker  Pertinent  Medical History   Past Medical History:  Diagnosis Date   Depression      Significant Hospital Events: Including procedures, antibiotic start and stop dates in addition to other pertinent events   CT chest 11/02/2024 with moderate pneumothorax 11/20 TCTS performed mechanical pleurodesis  Interim History / Subjective:   Pleurodesis yesterday On room air No sob CT in place Cxr this am trace right apical pneumo  Objective    Blood pressure 113/73, pulse 80, temperature 99.1 F (37.3 C), temperature source Oral, resp. rate 19, height 5' 8 (1.727 m), weight 96.6 kg, SpO2 95%.        Intake/Output Summary (Last 24 hours) at 11/06/2024 1145 Last data filed at 11/06/2024 0900 Gross per 24 hour  Intake 1569.87 ml  Output 108 ml  Net 1461.87 ml   Filed Weights   11/02/24 1841  Weight: 96.6 kg    Examination: General:  NAD HEENT: MM pink/moist Neuro: Aox3; MAE CV: s1s2, RRR, no m/r/g PULM:  dim clear BS bilaterally; room air GI: soft, bsx4 active  Extremities: warm/dry, no edema  Skin: no rashes or lesions    Resolved problem list   Assessment and Plan   Recurrent right pneumothorax, non-smoker, no underlying lung disease Plan: -TCTS following -s/p pleurodesis 11/20 -cont CT to suction for now -cxr in am -pulm toilet  JD Emilio RIGGERS [facility] 11/06/2024, 12:06 PM  Please see [email or web address] for pager details.  From 7A-7P if no response, please call [phone]. After hours, please call ELink [phone].

## 2024-11-06 NOTE — Progress Notes (Addendum)
 1 Day Post-Op Procedure(s) (LRB): WEDGE RESECTION, LUNG, ROBOT-ASSISTED, THORACOSCOPIC (Right) MECHANICAL PLEURODESIS (Right) Subjective: Feeld ok  Objective: Vital signs in last 24 hours: Temp:  [97.9 F (36.6 C)-99.7 F (37.6 C)] 98.5 F (36.9 C) (11/21 0300) Pulse Rate:  [73-115] 98 (11/21 0300) Cardiac Rhythm: Sinus tachycardia (11/20 1900) Resp:  [11-20] 15 (11/21 0300) BP: (101-134)/(63-79) 105/73 (11/21 0300) SpO2:  [90 %-100 %] 90 % (11/21 0300)  Hemodynamic parameters for last 24 hours:    Intake/Output from previous day: 11/20 0701 - 11/21 0700 In: 1515.9 [P.O.:360; I.V.:806; IV Piggyback:349.9] Out: 188 [Urine:50; Chest Tube:138] Intake/Output this shift: No intake/output data recorded.  General appearance: alert, cooperative, and no distress Heart: regular rate and rhythm Lungs: clear to auscultation bilaterally Abdomen: benign Extremities: no calf tenderness Wound: incis healing well  Lab Results: Recent Labs    11/05/24 0232  WBC 9.4  HGB 13.5  HCT 40.4  PLT 333   BMET:  Recent Labs    11/05/24 0232 11/06/24 0215  NA 137 136  K 3.8 4.3  CL 104 105  CO2 23 22  GLUCOSE 103* 156*  BUN 15 11  CREATININE 0.66 0.66  CALCIUM 9.0 8.9    PT/INR: No results for input(s): LABPROT, INR in the last 72 hours. ABG No results found for: PHART, HCO3, TCO2, ACIDBASEDEF, O2SAT CBG (last 3)  No results for input(s): GLUCAP in the last 72 hours.  Meds Scheduled Meds:  acetaminophen   1,000 mg Oral Q6H   Or   acetaminophen  (TYLENOL ) oral liquid 160 mg/5 mL  1,000 mg Oral Q6H   bisacodyl   10 mg Oral Daily   enoxaparin  (LOVENOX ) injection  40 mg Subcutaneous Daily   escitalopram   20 mg Oral QHS   ketorolac   15 mg Intravenous Q6H   pantoprazole   40 mg Oral Daily   senna-docusate  1 tablet Oral QHS   Continuous Infusions: PRN Meds:.methocarbamol , morphine  injection, ondansetron  (ZOFRAN ) IV, oxyCODONE , traMADol   Xrays DG Chest Port 1  View Result Date: 11/05/2024 CLINICAL DATA:  Status post wedge resection of the right lung. EXAM: PORTABLE CHEST 1 VIEW COMPARISON:  Chest radiograph dated 11/04/2024. FINDINGS: Right-sided chest tube projecting over the right upper lung field. Right perihilar opacity, likely atelectasis. Pneumonia is not excluded. No pleural effusion. No detectable pneumothorax. The cardiac silhouette is within normal limits. No acute osseous pathology. IMPRESSION: 1. Right-sided chest tube. No detectable pneumothorax. 2. Right perihilar opacity, likely atelectasis. Electronically Signed   By: Vanetta Chou M.D.   On: 11/05/2024 19:42   DG CHEST PORT 1 VIEW Result Date: 11/04/2024 EXAM: 1 VIEW(S) XRAY OF THE CHEST 11/04/2024 06:56:00 PM COMPARISON: 11/04/2024 02:54 PM CLINICAL HISTORY: Chest tube in place FINDINGS: LINES, TUBES AND DEVICES: Stable right pigtail pleural drainage catheter overlying the right mid lung. LUNGS AND PLEURA: No focal pulmonary opacity. No pleural effusion. No remaining radiographic evidence of pneumothorax. HEART AND MEDIASTINUM: No acute abnormality of the cardiac and mediastinal silhouettes. BONES AND SOFT TISSUES: No acute osseous abnormality. IMPRESSION: 1. No remaining radiographic evidence of pneumothorax. 2. Stable right pigtail pleural drainage catheter overlying the right mid lung. Electronically signed by: Morgane Naveau MD 11/04/2024 11:42 PM EST RP Workstation: [id number]   DG CHEST PORT 1 VIEW Result Date: 11/04/2024 CLINICAL DATA:  [id number] Pneumothorax [id number] EXAM: PORTABLE CHEST 1 VIEW COMPARISON:  November 02, 2024 FINDINGS: The cardiomediastinal silhouette is unchanged in contour.RIGHT-sided chest tube. No pleural effusion. No significant pneumothorax. No acute pleuroparenchymal abnormality. Mildly prominent transverse processes of C7.  IMPRESSION: RIGHT-sided chest tube without significant pneumothorax. Electronically Signed   By: Corean Salter M.D.   On: 11/04/2024 15:40     Assessment/Plan: S/P Procedure(s) (LRB): WEDGE RESECTION, LUNG, ROBOT-ASSISTED, THORACOSCOPIC (Right) MECHANICAL PLEURODESIS (Right) POD#1  1 afeb, Tmax 99.7, SR/ST, pain adeq controlled 2 O2 sats good on RA 3 CT 138 ml- cont to suction 4 normal renal fxn 5 CBC pending 6 CXR- no pntx, minor atx 7 cont pulm hygiene and mobilize 8 lovenox  for DVT ppx       LOS: 3 days    Lemond FORBES Cera PA-C Pager [phone] 11/06/2024

## 2024-11-06 NOTE — Anesthesia Postprocedure Evaluation (Signed)
 Anesthesia Post Note  Patient: [facility]  Procedure(s) Performed: WEDGE RESECTION, LUNG, ROBOT-ASSISTED, THORACOSCOPIC (Right: Chest) MECHANICAL PLEURODESIS (Right: Chest)     Patient location during evaluation: PACU Anesthesia Type: General Level of consciousness: awake and alert Pain management: pain level controlled Vital Signs Assessment: post-procedure vital signs reviewed and stable Respiratory status: spontaneous breathing, nonlabored ventilation and respiratory function stable Cardiovascular status: blood pressure returned to baseline and stable Postop Assessment: no apparent nausea or vomiting Anesthetic complications: no   No notable events documented.                Rozetta Stumpp

## 2024-11-07 ENCOUNTER — Inpatient Hospital Stay (HOSPITAL_COMMUNITY)

## 2024-11-07 LAB — COMPREHENSIVE METABOLIC PANEL WITH GFR
ALT: 13 U/L (ref 0–44)
AST: 14 U/L — ABNORMAL LOW (ref 15–41)
Albumin: 3.3 g/dL — ABNORMAL LOW (ref 3.5–5.0)
Alkaline Phosphatase: 51 U/L (ref 38–126)
Anion gap: 9 (ref 5–15)
BUN: 11 mg/dL (ref 6–20)
CO2: 22 mmol/L (ref 22–32)
Calcium: 8.5 mg/dL — ABNORMAL LOW (ref 8.9–10.3)
Chloride: 108 mmol/L (ref 98–111)
Creatinine, Ser: 0.72 mg/dL (ref 0.44–1.00)
GFR, Estimated: >60 mL/min (ref >60)
Glucose, Bld: 97 mg/dL (ref 70–99)
Potassium: 3.8 mmol/L (ref 3.5–5.1)
Sodium: 139 mmol/L (ref 135–145)
Total Bilirubin: 0.6 mg/dL (ref 0.0–1.2)
Total Protein: 6.4 g/dL — ABNORMAL LOW (ref 6.5–8.1)

## 2024-11-07 LAB — CBC
HCT: 36.8 % (ref 36.0–46.0)
Hemoglobin: 12.5 g/dL (ref 12.0–15.0)
MCH: 30.4 pg (ref 26.0–34.0)
MCHC: 34 g/dL (ref 30.0–36.0)
MCV: 89.5 fL (ref 80.0–100.0)
Platelets: 303 K/uL (ref 150–400)
RBC: 4.11 MIL/uL (ref 3.87–5.11)
RDW: 11.8 % (ref 11.5–15.5)
WBC: 10.7 K/uL — ABNORMAL HIGH (ref 4.0–10.5)
nRBC: 0 % (ref 0.0–0.2)

## 2024-11-07 NOTE — Plan of Care (Signed)
  Problem: Education: Goal: Knowledge of General Education information will improve Description: Including pain rating scale, medication(s)/side effects and non-pharmacologic comfort measures Outcome: Progressing   Problem: Health Behavior/Discharge Planning: Goal: Ability to manage health-related needs will improve Outcome: Progressing   Problem: Clinical Measurements: Goal: Ability to maintain clinical measurements within normal limits will improve Outcome: Progressing Goal: Will remain free from infection Outcome: Progressing Goal: Diagnostic test results will improve Outcome: Progressing Goal: Respiratory complications will improve Outcome: Progressing Goal: Cardiovascular complication will be avoided Outcome: Progressing   Problem: Activity: Goal: Risk for activity intolerance will decrease Outcome: Progressing   Problem: Nutrition: Goal: Adequate nutrition will be maintained Outcome: Progressing   Problem: Coping: Goal: Level of anxiety will decrease Outcome: Progressing   Problem: Elimination: Goal: Will not experience complications related to bowel motility Outcome: Progressing Goal: Will not experience complications related to urinary retention Outcome: Progressing   Problem: Pain Managment: Goal: General experience of comfort will improve and/or be controlled Outcome: Progressing   Problem: Safety: Goal: Ability to remain free from injury will improve Outcome: Progressing   Problem: Education: Goal: Knowledge of disease or condition will improve Outcome: Progressing Goal: Knowledge of the prescribed therapeutic regimen will improve Outcome: Progressing   Problem: Activity: Goal: Risk for activity intolerance will decrease Outcome: Progressing   Problem: Cardiac: Goal: Will achieve and/or maintain hemodynamic stability Outcome: Progressing   Problem: Clinical Measurements: Goal: Postoperative complications will be avoided or minimized Outcome:  Progressing   Problem: Respiratory: Goal: Respiratory status will improve Outcome: Progressing   Problem: Pain Management: Goal: Pain level will decrease Outcome: Progressing   Problem: Skin Integrity: Goal: Wound healing without signs and symptoms infection will improve Outcome: Progressing

## 2024-11-07 NOTE — Progress Notes (Signed)
 Brief PCCM progress note  No further pulmonary/critical care needs identified.  Cardiothoracic surgery is managing patient's chest tube therefore at this time PCCM will sign off. Thank you for the opportunity to participate in this patient's care. Please contact if we can be of further assistance.  Jordany Russett D. Harris, NP-C [facility] Personal contact information can be found on Amion  If no contact or response made please call [phone] 11/07/2024, 8:11 AM

## 2024-11-07 NOTE — Plan of Care (Signed)
   Problem: Education: Goal: Knowledge of General Education information will improve Description: Including pain rating scale, medication(s)/side effects and non-pharmacologic comfort measures Outcome: Progressing   Problem: Activity: Goal: Risk for activity intolerance will decrease Outcome: Progressing   Problem: Elimination: Goal: Will not experience complications related to bowel motility Outcome: Progressing

## 2024-11-07 NOTE — Progress Notes (Addendum)
 2 Days Post-Op Procedure(s) (LRB): WEDGE RESECTION, LUNG, ROBOT-ASSISTED, THORACOSCOPIC (Right) MECHANICAL PLEURODESIS (Right) Subjective: Feels well  Objective: Vital signs in last 24 hours: Temp:  [98.2 F (36.8 C)-99.1 F (37.3 C)] 98.8 F (37.1 C) (11/22 0740) Pulse Rate:  [76-91] 80 (11/22 0740) Cardiac Rhythm: Normal sinus rhythm (11/22 0755) Resp:  [14-20] 16 (11/22 0740) BP: (107-133)/(60-78) 107/60 (11/22 0740) SpO2:  [94 %-98 %] 98 % (11/22 0740)  Hemodynamic parameters for last 24 hours:    Intake/Output from previous day: 11/21 0701 - 11/22 0700 In: 180 [P.O.:180] Out: 34 [Chest Tube:34] Intake/Output this shift: No intake/output data recorded.  General appearance: alert, cooperative, and no distress Heart: regular rate and rhythm Lungs: clear to auscultation bilaterally Abdomen: benign Extremities: no edema or calf tenderness Wound: incis healing well  Lab Results: Recent Labs    11/05/24 0232 11/07/24 0231  WBC 9.4 10.7*  HGB 13.5 12.5  HCT 40.4 36.8  PLT 333 303   BMET:  Recent Labs    11/06/24 0215 11/07/24 0231  NA 136 139  K 4.3 3.8  CL 105 108  CO2 22 22  GLUCOSE 156* 97  BUN 11 11  CREATININE 0.66 0.72  CALCIUM 8.9 8.5*    PT/INR: No results for input(s): LABPROT, INR in the last 72 hours. ABG No results found for: PHART, HCO3, TCO2, ACIDBASEDEF, O2SAT CBG (last 3)  No results for input(s): GLUCAP in the last 72 hours.  Meds Scheduled Meds:  acetaminophen   1,000 mg Oral Q6H   Or   acetaminophen  (TYLENOL ) oral liquid 160 mg/5 mL  1,000 mg Oral Q6H   bisacodyl   10 mg Oral Daily   enoxaparin  (LOVENOX ) injection  40 mg Subcutaneous Daily   escitalopram   20 mg Oral QHS   ketorolac   15 mg Intravenous Q6H   pantoprazole   40 mg Oral Daily   senna-docusate  1 tablet Oral QHS   Continuous Infusions: PRN Meds:.methocarbamol , morphine  injection, ondansetron  (ZOFRAN ) IV, oxyCODONE , traMADol   Xrays DG Chest 1  View Result Date: 11/07/2024 EXAM: 1 VIEW(S) XRAY OF THE CHEST 11/07/2024 07:14:00 AM COMPARISON: 11/06/2024 CLINICAL HISTORY: Surgery follow-up examination [id number]; [id number] Pneumothorax on right [id number] FINDINGS: LINES, TUBES AND DEVICES: Right chest tube in place terminating at right apex. LUNGS AND PLEURA: Trace right apical pneumothorax, decreased from prior. Linear atelectasis at left lung base. No pleural effusion. HEART AND MEDIASTINUM: No acute abnormality of the cardiac and mediastinal silhouettes. BONES AND SOFT TISSUES: No acute osseous abnormality. IMPRESSION: 1. Trace right apical pneumothorax, decreased from prior. 2. Right chest tube with tip at the right apex. Electronically signed by: Waddell Calk MD 11/07/2024 07:34 AM EST RP Workstation: [id number]   DG Chest Port 1 View Result Date: 11/06/2024 EXAM: 1 VIEW(S) XRAY OF THE CHEST 11/06/2024 06:20:00 AM COMPARISON: 11/05/2024 CLINICAL HISTORY: S/P lung surgery, follow-up exam FINDINGS: LINES, TUBES AND DEVICES: Right chest tube stable in place with tip at right apex. LUNGS AND PLEURA: Trace right apical pneumothorax. Right perihilar opacity, likely postoperative atelectasis. No pleural effusion. HEART AND MEDIASTINUM: No acute abnormality of the cardiac and mediastinal silhouettes. BONES AND SOFT TISSUES: No acute osseous abnormality. IMPRESSION: 1. Trace right apical pneumothorax. . 2. Right perihilar opacity, likely postoperative atelectasis. Electronically signed by: Waddell Calk MD 11/06/2024 08:43 AM EST RP Workstation: [id number]   DG Chest Port 1 View Result Date: 11/05/2024 CLINICAL DATA:  Status post wedge resection of the right lung. EXAM: PORTABLE CHEST 1 VIEW COMPARISON:  Chest radiograph dated 11/04/2024. FINDINGS: Right-sided  chest tube projecting over the right upper lung field. Right perihilar opacity, likely atelectasis. Pneumonia is not excluded. No pleural effusion. No detectable pneumothorax. The cardiac silhouette is  within normal limits. No acute osseous pathology. IMPRESSION: 1. Right-sided chest tube. No detectable pneumothorax. 2. Right perihilar opacity, likely atelectasis. Electronically Signed   By: Vanetta Chou M.D.   On: 11/05/2024 19:42    Assessment/Plan: S/P Procedure(s) (LRB): WEDGE RESECTION, LUNG, ROBOT-ASSISTED, THORACOSCOPIC (Right) MECHANICAL PLEURODESIS (Right) POD#2  1 afeb, VSS, SR 2 O2 sats good on RA 3 voiding- unmeasured 4 CT 34 ml recorded/24h-no air leak,  will place to H2O seal 5 CXR trace R apical pntx, improved 6 normal renal fxn 7 not anemic 8 routine pulm hygiene and rehab, likely remove tube in am and discharge tomorrow     LOS: 4 days    Lemond FORBES Cera PA-C Pager [phone] 11/07/2024   Chart reviewed, patient examined, agree with above.  CXR ok. No air leak. Tube to water seal. Repeat CXR in am.

## 2024-11-08 ENCOUNTER — Inpatient Hospital Stay (HOSPITAL_COMMUNITY)

## 2024-11-08 MED ORDER — TRAMADOL HCL 50 MG PO TABS: 50.0000 mg | ORAL_TABLET | Freq: Four times a day (QID) | ORAL | 0 refills | Status: AC | PRN

## 2024-11-08 MED ORDER — METHOCARBAMOL 750 MG PO TABS: 750.0000 mg | ORAL_TABLET | Freq: Four times a day (QID) | ORAL | 0 refills | Status: DC | PRN

## 2024-11-08 NOTE — Progress Notes (Addendum)
 3 Days Post-Op Procedure(s) (LRB): WEDGE RESECTION, LUNG, ROBOT-ASSISTED, THORACOSCOPIC (Right) MECHANICAL PLEURODESIS (Right) Subjective: Feels ok, some soreness  Objective: Vital signs in last 24 hours: Temp:  [97.6 F (36.4 C)-99.1 F (37.3 C)] 97.6 F (36.4 C) (11/23 0733) Pulse Rate:  [68-89] 69 (11/23 0733) Cardiac Rhythm: Normal sinus rhythm (11/22 1900) Resp:  [14-20] 14 (11/23 0733) BP: (103-118)/(60-75) 108/60 (11/23 0733) SpO2:  [95 %-98 %] 95 % (11/23 0733)  Hemodynamic parameters for last 24 hours:    Intake/Output from previous day: 11/22 0701 - 11/23 0700 In: 120 [P.O.:120] Out: 48 [Chest Tube:48] Intake/Output this shift: No intake/output data recorded.  General appearance: alert, cooperative, and no distress Heart: regular rate and rhythm Lungs: clear to auscultation bilaterally Abdomen: benign Extremities: warm well perfused Wound: incis healing well  Lab Results: Recent Labs    11/07/24 0231  WBC 10.7*  HGB 12.5  HCT 36.8  PLT 303   BMET:  Recent Labs    11/06/24 0215 11/07/24 0231  NA 136 139  K 4.3 3.8  CL 105 108  CO2 22 22  GLUCOSE 156* 97  BUN 11 11  CREATININE 0.66 0.72  CALCIUM 8.9 8.5*    PT/INR: No results for input(s): LABPROT, INR in the last 72 hours. ABG No results found for: PHART, HCO3, TCO2, ACIDBASEDEF, O2SAT CBG (last 3)  No results for input(s): GLUCAP in the last 72 hours.  Meds Scheduled Meds:  acetaminophen   1,000 mg Oral Q6H   Or   acetaminophen  (TYLENOL ) oral liquid 160 mg/5 mL  1,000 mg Oral Q6H   bisacodyl   10 mg Oral Daily   enoxaparin  (LOVENOX ) injection  40 mg Subcutaneous Daily   escitalopram   20 mg Oral QHS   pantoprazole   40 mg Oral Daily   senna-docusate  1 tablet Oral QHS   Continuous Infusions: PRN Meds:.methocarbamol , morphine  injection, ondansetron  (ZOFRAN ) IV, oxyCODONE , traMADol   Xrays DG Chest 1 View Result Date: 11/07/2024 EXAM: 1 VIEW(S) XRAY OF THE CHEST  11/07/2024 07:14:00 AM COMPARISON: 11/06/2024 CLINICAL HISTORY: Surgery follow-up examination [id number]; [id number] Pneumothorax on right [id number] FINDINGS: LINES, TUBES AND DEVICES: Right chest tube in place terminating at right apex. LUNGS AND PLEURA: Trace right apical pneumothorax, decreased from prior. Linear atelectasis at left lung base. No pleural effusion. HEART AND MEDIASTINUM: No acute abnormality of the cardiac and mediastinal silhouettes. BONES AND SOFT TISSUES: No acute osseous abnormality. IMPRESSION: 1. Trace right apical pneumothorax, decreased from prior. 2. Right chest tube with tip at the right apex. Electronically signed by: Waddell Calk MD 11/07/2024 07:34 AM EST RP Workstation: [id number]    Assessment/Plan: S/P Procedure(s) (LRB): WEDGE RESECTION, LUNG, ROBOT-ASSISTED, THORACOSCOPIC (Right) MECHANICAL PLEURODESIS (Right) POD#3  1 afeb, VSS, NSR 2 sats good on RA 3 CT 48 ml /24 h recorded 4 CXR- no def pntx to my read 5 will remove CT this am and repeat CXR- home if no issues later today    LOS: 5 days    Crystal FORBES Cera PA-C Pager [phone] 11/08/2024   Chart reviewed, patient examined, agree with above.  CXR looks fine. Questionable tiny apical ptx. No air leak or tidaling. Remove chest tube and repeat CXR. Home later if no changes.

## 2024-11-08 NOTE — Plan of Care (Signed)

## 2024-11-09 DIAGNOSIS — Z902 Acquired absence of lung [part of]: Secondary | ICD-10-CM

## 2024-11-09 LAB — SURGICAL PATHOLOGY

## 2024-11-18 ENCOUNTER — Other Ambulatory Visit: Payer: Self-pay | Admitting: Thoracic Surgery (Cardiothoracic Vascular Surgery)

## 2024-11-18 DIAGNOSIS — J9383 Other pneumothorax: Secondary | ICD-10-CM

## 2024-11-19 ENCOUNTER — Inpatient Hospital Stay (HOSPITAL_COMMUNITY): Admission: RE | Admit: 2024-11-19 | Discharge: 2024-11-19 | Attending: Internal Medicine

## 2024-11-19 ENCOUNTER — Ambulatory Visit: Payer: Self-pay

## 2024-11-19 VITALS — Ht 68.0 in

## 2024-11-19 DIAGNOSIS — J9383 Other pneumothorax: Secondary | ICD-10-CM | POA: Insufficient documentation

## 2024-11-19 DIAGNOSIS — Z09 Encounter for follow-up examination after completed treatment for conditions other than malignant neoplasm: Secondary | ICD-10-CM

## 2024-11-19 NOTE — Progress Notes (Signed)
 [address] [place] [address]             [phone]       HPI:  Patient returns for routine postoperative follow-up having undergone Robotic assisted right video thoracoscopy, Wedge resection of the right lower lobe, Apical pleurectomy, Mechanical pleurodesis and Intercostal nerve block on 11/05/2024 with Dr. Shyrl.  The patient's early postoperative recovery while in the hospital was notable for having her chest tube transitioned to water seal on postop day 2.  She was able to have her chest tube removed postop day 3.  Chest x-ray showed no pneumothorax.  She was stable for discharge home on 11/08/2024.  Since hospital discharge the patient reports that she has been doing well.  Her pain is well controlled and she is not requiring any medications at this time.  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  She report no issues expect feeling tired after working all day.  She has been active.  She denies chest pain and shortness of breath.     Allergies as of 11/19/2024   No Known Allergies      Medication List        Accurate as of November 19, 2024 11:40 AM. If you have any questions, ask your nurse or doctor.          STOP taking these medications    methocarbamol  750 MG tablet Commonly known as: ROBAXIN    Tylenol  Dissolve Packs 500 MG Pack Generic drug: Acetaminophen        TAKE these medications    escitalopram  20 MG tablet Commonly known as: LEXAPRO  Take 20 mg by mouth at bedtime.   Multivitamin Gummies Womens Chew Chew 2 each by mouth daily.         ROS Review of Systems  Constitutional:  Negative for malaise/fatigue.  Respiratory: Negative.  Negative for cough and shortness of breath.   Cardiovascular: Negative.  Negative for chest pain, palpitations and leg swelling.      Ht 5' 8 (1.727 m)   BMI 32.39 kg/m   Physical Exam Constitutional:      Appearance: Normal appearance.  HENT:     Head:  Normocephalic and atraumatic.  Cardiovascular:     Rate and Rhythm: Normal rate and regular rhythm.     Heart sounds: Normal heart sounds, S1 normal and S2 normal.  Pulmonary:     Effort: Pulmonary effort is normal.     Breath sounds: Normal breath sounds.  Musculoskeletal:     Cervical back: Normal range of motion.  Skin:    General: Skin is warm and dry.      Neurological:     General: No focal deficit present.     Mental Status: She is alert.       Imaging: CLINICAL DATA:  VATS.  Spontaneous pneumothorax.   EXAM: CHEST - 2 VIEW   COMPARISON:  11/08/2024   FINDINGS: Lungs are adequately inflated without airspace consolidation, effusion or pneumothorax. Cardiomediastinal silhouette and remainder of the exam is unchanged.   IMPRESSION: No active cardiopulmonary disease.     Electronically Signed   By: Toribio Agreste M.D.   On: 11/19/2024 10:12   Assessment/Plan:  Surgery follow-up examination -We discussed today's chest xray which showed no pneumothorax -She should keep incision sites clean and dry -Discussed that she should slowly increase her activity.  She should not do strenuous exercise/lifting over 10 pounds  until she is a full 8 weeks from surgery.   -She can use tylenol  or advil as needed for pain  -Follow up in one month with chest x-ray  Manuelita CHRISTELLA Rough, PA-C 11:40 AM 11/19/24

## 2024-11-19 NOTE — Patient Instructions (Signed)
Follow up in one month with chest xray

## 2024-12-04 ENCOUNTER — Other Ambulatory Visit: Payer: Self-pay | Admitting: Thoracic Surgery (Cardiothoracic Vascular Surgery)

## 2024-12-04 DIAGNOSIS — J9383 Other pneumothorax: Secondary | ICD-10-CM

## 2024-12-21 ENCOUNTER — Ambulatory Visit: Payer: Self-pay

## 2024-12-21 ENCOUNTER — Other Ambulatory Visit (HOSPITAL_COMMUNITY)

## 2024-12-21 ENCOUNTER — Encounter (HOSPITAL_COMMUNITY): Payer: Self-pay

## 2024-12-22 ENCOUNTER — Other Ambulatory Visit: Payer: Self-pay | Admitting: Thoracic Surgery (Cardiothoracic Vascular Surgery)

## 2024-12-22 DIAGNOSIS — J9383 Other pneumothorax: Secondary | ICD-10-CM

## 2024-12-23 ENCOUNTER — Ambulatory Visit (HOSPITAL_COMMUNITY)
Admission: RE | Admit: 2024-12-23 | Discharge: 2024-12-23 | Disposition: A | Discharge status: Home / Self Care | Source: Ambulatory Visit | Attending: Internal Medicine | Admitting: Internal Medicine

## 2024-12-23 ENCOUNTER — Ambulatory Visit: Payer: Self-pay

## 2024-12-23 VITALS — BP 129/84 | HR 82 | Resp 18 | Ht 68.0 in | Wt 215.0 lb

## 2024-12-23 DIAGNOSIS — J9383 Other pneumothorax: Secondary | ICD-10-CM

## 2024-12-23 DIAGNOSIS — Z09 Encounter for follow-up examination after completed treatment for conditions other than malignant neoplasm: Secondary | ICD-10-CM

## 2024-12-23 NOTE — Progress Notes (Signed)
 "     [address] [place] [address]             [phone]       HPI:  Patient returns for routine postoperative follow-up having undergone Robotic assisted right video thoracoscopy, Wedge resection of the right lower lobe, Apical pleurectomy, Mechanical pleurodesis and Intercostal nerve block on 11/05/2024 with Dr. Shyrl.  The patient's early postoperative recovery while in the hospital was notable for having her chest tube transitioned to water seal on postop day 2.  She was able to have her chest tube removed postop day 3.  Chest x-ray showed no pneumothorax.  She was stable for discharge home on 11/08/2024.  She presents today with her mother for continued follow-up.  Overall she has been doing well postoperatively but finds that she does experience some anxiety at times.  She gets anxious and thinks that she has another spontaneous pneumothorax.  When this happens that she can feel short of breath.  These episodes only last a couple minutes and resolve.    Allergies as of 12/23/2024   No Known Allergies      Medication List        Accurate as of December 23, 2024  4:04 PM. If you have any questions, ask your nurse or doctor.          escitalopram  20 MG tablet Commonly known as: LEXAPRO  Take 20 mg by mouth at bedtime.   Multivitamin Gummies Womens Chew Chew 2 each by mouth daily.         ROS Review of Systems  Constitutional:  Negative for malaise/fatigue.  Respiratory: Negative.  Negative for cough and shortness of breath.   Cardiovascular: Negative.  Negative for chest pain, palpitations and leg swelling.  Psychiatric/Behavioral:  The patient is nervous/anxious.       BP 129/84 (BP Location: Right Arm)   Pulse 82   Resp 18   Ht 5' 8 (1.727 m)   Wt 215 lb (97.5 kg)   SpO2 100%   BMI 32.69 kg/m   Physical Exam Constitutional:      Appearance: Normal appearance.  HENT:     Head: Normocephalic and atraumatic.   Cardiovascular:     Rate and Rhythm: Normal rate and regular rhythm.     Heart sounds: Normal heart sounds, S1 normal and S2 normal.  Pulmonary:     Effort: Pulmonary effort is normal.     Breath sounds: Normal breath sounds.  Musculoskeletal:     Cervical back: Normal range of motion.  Skin:    General: Skin is warm and dry.     Comments: Incision sites healed  Neurological:     General: No focal deficit present.     Mental Status: She is alert.       Imaging: EXAM: 2 VIEW(S) XRAY OF THE CHEST 12/23/2024 03:38:52 PM   COMPARISON: 11/19/2024   CLINICAL HISTORY: pneumothorax   FINDINGS:   LUNGS AND PLEURA: Resolved right upper lobe opacity. No pleural effusion. No pneumothorax.   HEART AND MEDIASTINUM: No acute abnormality of the cardiac and mediastinal silhouettes.   BONES AND SOFT TISSUES: No acute osseous abnormality.   IMPRESSION: 1. No acute cardiopulmonary abnormality. No pneumothorax.   Electronically signed by: Rogelia Myers MD MD 12/23/2024 03:59 PM EST RP Workstation: [id number]  Assessment/Plan:  Surgery follow-up examination -We discussed today's chest xray which showed no pneumothorax -Discussed that she should slowly increase her activity.  Her lifting restrictions have been lifted.  She can return to going to the gym and should slowly increase her strenuous activity - Discussed anxiety around spontaneous pneumothorax.  She should attempt deep breaths and calming exercises to help. If she is experiencing high anxiety and needs reassurance that pneumothorax has not reoccurred then she can reach out to our clinic to obtain a chest x-ray.  -If she is to experience shortness of breath that does not resolve, chest pain or tightness then she would need to present to the emergency department -Follow up as needed with [facility]  Manuelita CHRISTELLA Rough, PA-C 4:04 PM 12/23/2024  "
